# Patient Record
Sex: Female | Born: 1935 | Race: White | Hispanic: No | Marital: Married | Smoking: Never smoker
Health system: Southern US, Community
[De-identification: ages and names within clinical notes are randomized; demographics above are authoritative.]

## PROBLEM LIST (undated history)

## (undated) DIAGNOSIS — K5790 Diverticulosis of intestine, part unspecified, without perforation or abscess without bleeding: Secondary | ICD-10-CM

## (undated) DIAGNOSIS — K219 Gastro-esophageal reflux disease without esophagitis: Secondary | ICD-10-CM

## (undated) DIAGNOSIS — M1990 Unspecified osteoarthritis, unspecified site: Secondary | ICD-10-CM

## (undated) DIAGNOSIS — E785 Hyperlipidemia, unspecified: Secondary | ICD-10-CM

## (undated) DIAGNOSIS — F419 Anxiety disorder, unspecified: Secondary | ICD-10-CM

## (undated) DIAGNOSIS — R002 Palpitations: Secondary | ICD-10-CM

## (undated) DIAGNOSIS — E119 Type 2 diabetes mellitus without complications: Secondary | ICD-10-CM

## (undated) DIAGNOSIS — F32A Depression, unspecified: Secondary | ICD-10-CM

## (undated) DIAGNOSIS — K589 Irritable bowel syndrome without diarrhea: Secondary | ICD-10-CM

## (undated) DIAGNOSIS — E039 Hypothyroidism, unspecified: Secondary | ICD-10-CM

## (undated) DIAGNOSIS — D649 Anemia, unspecified: Secondary | ICD-10-CM

## (undated) DIAGNOSIS — F329 Major depressive disorder, single episode, unspecified: Secondary | ICD-10-CM

## (undated) HISTORY — DX: Hypothyroidism, unspecified: E03.9

## (undated) HISTORY — DX: Gastro-esophageal reflux disease without esophagitis: K21.9

## (undated) HISTORY — DX: Hyperlipidemia, unspecified: E78.5

## (undated) HISTORY — DX: Anemia, unspecified: D64.9

## (undated) HISTORY — PX: TONSILLECTOMY: SUR1361

## (undated) HISTORY — DX: Irritable bowel syndrome, unspecified: K58.9

## (undated) HISTORY — DX: Depression, unspecified: F32.A

## (undated) HISTORY — PX: APPENDECTOMY: SHX54

## (undated) HISTORY — DX: Palpitations: R00.2

## (undated) HISTORY — DX: Unspecified osteoarthritis, unspecified site: M19.90

## (undated) HISTORY — DX: Anxiety disorder, unspecified: F41.9

## (undated) HISTORY — DX: Diverticulosis of intestine, part unspecified, without perforation or abscess without bleeding: K57.90

## (undated) HISTORY — DX: Major depressive disorder, single episode, unspecified: F32.9

## (undated) HISTORY — DX: Type 2 diabetes mellitus without complications: E11.9

---

## 1998-04-15 ENCOUNTER — Ambulatory Visit (HOSPITAL_COMMUNITY): Admission: RE | Admit: 1998-04-15 | Discharge: 1998-04-15 | Payer: Self-pay | Admitting: Internal Medicine

## 1998-07-02 ENCOUNTER — Other Ambulatory Visit: Admission: RE | Admit: 1998-07-02 | Discharge: 1998-07-02 | Payer: Self-pay | Admitting: Gastroenterology

## 1999-08-12 ENCOUNTER — Ambulatory Visit (HOSPITAL_COMMUNITY): Admission: RE | Admit: 1999-08-12 | Discharge: 1999-08-12 | Payer: Self-pay | Admitting: Gastroenterology

## 2000-06-20 ENCOUNTER — Encounter (INDEPENDENT_AMBULATORY_CARE_PROVIDER_SITE_OTHER): Payer: Self-pay | Admitting: Specialist

## 2000-06-20 ENCOUNTER — Other Ambulatory Visit: Admission: RE | Admit: 2000-06-20 | Discharge: 2000-06-20 | Payer: Self-pay | Admitting: Gastroenterology

## 2001-05-15 ENCOUNTER — Other Ambulatory Visit: Admission: RE | Admit: 2001-05-15 | Discharge: 2001-05-15 | Payer: Self-pay | Admitting: Obstetrics and Gynecology

## 2001-08-10 ENCOUNTER — Other Ambulatory Visit: Admission: RE | Admit: 2001-08-10 | Discharge: 2001-08-10 | Payer: Self-pay | Admitting: Gastroenterology

## 2001-08-10 ENCOUNTER — Encounter (INDEPENDENT_AMBULATORY_CARE_PROVIDER_SITE_OTHER): Payer: Self-pay | Admitting: Specialist

## 2004-05-25 ENCOUNTER — Ambulatory Visit (HOSPITAL_COMMUNITY): Admission: RE | Admit: 2004-05-25 | Discharge: 2004-05-25 | Payer: Self-pay | Admitting: Family Medicine

## 2004-07-29 ENCOUNTER — Encounter (INDEPENDENT_AMBULATORY_CARE_PROVIDER_SITE_OTHER): Payer: Self-pay | Admitting: *Deleted

## 2004-07-29 ENCOUNTER — Ambulatory Visit (HOSPITAL_COMMUNITY): Admission: RE | Admit: 2004-07-29 | Discharge: 2004-07-29 | Payer: Self-pay | Admitting: Gastroenterology

## 2005-05-26 ENCOUNTER — Ambulatory Visit (HOSPITAL_COMMUNITY): Admission: RE | Admit: 2005-05-26 | Discharge: 2005-05-26 | Payer: Self-pay | Admitting: Family Medicine

## 2006-05-01 ENCOUNTER — Ambulatory Visit: Payer: Self-pay | Admitting: Gastroenterology

## 2006-05-02 ENCOUNTER — Ambulatory Visit: Payer: Self-pay | Admitting: Cardiology

## 2006-05-29 ENCOUNTER — Ambulatory Visit (HOSPITAL_COMMUNITY): Admission: RE | Admit: 2006-05-29 | Discharge: 2006-05-29 | Payer: Self-pay | Admitting: Family Medicine

## 2006-06-02 ENCOUNTER — Ambulatory Visit: Payer: Self-pay | Admitting: Gastroenterology

## 2006-07-06 ENCOUNTER — Encounter (INDEPENDENT_AMBULATORY_CARE_PROVIDER_SITE_OTHER): Payer: Self-pay | Admitting: Gastroenterology

## 2006-07-06 ENCOUNTER — Ambulatory Visit: Payer: Self-pay | Admitting: Gastroenterology

## 2007-04-18 ENCOUNTER — Ambulatory Visit: Payer: Self-pay | Admitting: Gastroenterology

## 2007-06-12 ENCOUNTER — Ambulatory Visit: Payer: Self-pay | Admitting: Internal Medicine

## 2008-02-26 DIAGNOSIS — K5730 Diverticulosis of large intestine without perforation or abscess without bleeding: Secondary | ICD-10-CM | POA: Insufficient documentation

## 2008-02-26 DIAGNOSIS — F411 Generalized anxiety disorder: Secondary | ICD-10-CM | POA: Insufficient documentation

## 2008-02-26 DIAGNOSIS — E785 Hyperlipidemia, unspecified: Secondary | ICD-10-CM | POA: Insufficient documentation

## 2008-02-26 DIAGNOSIS — H409 Unspecified glaucoma: Secondary | ICD-10-CM | POA: Insufficient documentation

## 2008-02-26 DIAGNOSIS — F329 Major depressive disorder, single episode, unspecified: Secondary | ICD-10-CM

## 2008-02-26 DIAGNOSIS — K219 Gastro-esophageal reflux disease without esophagitis: Secondary | ICD-10-CM

## 2008-02-26 DIAGNOSIS — F32A Depression, unspecified: Secondary | ICD-10-CM | POA: Insufficient documentation

## 2008-03-10 ENCOUNTER — Ambulatory Visit: Payer: Self-pay | Admitting: Internal Medicine

## 2008-07-24 ENCOUNTER — Ambulatory Visit: Payer: Self-pay | Admitting: Internal Medicine

## 2008-07-24 ENCOUNTER — Telehealth: Payer: Self-pay | Admitting: Internal Medicine

## 2008-07-24 DIAGNOSIS — K589 Irritable bowel syndrome without diarrhea: Secondary | ICD-10-CM

## 2008-07-24 DIAGNOSIS — R109 Unspecified abdominal pain: Secondary | ICD-10-CM

## 2008-07-25 ENCOUNTER — Telehealth: Payer: Self-pay | Admitting: Internal Medicine

## 2008-08-06 ENCOUNTER — Telehealth: Payer: Self-pay | Admitting: Internal Medicine

## 2008-08-18 ENCOUNTER — Ambulatory Visit: Payer: Self-pay | Admitting: Internal Medicine

## 2008-09-17 ENCOUNTER — Telehealth: Payer: Self-pay | Admitting: Internal Medicine

## 2009-03-24 ENCOUNTER — Ambulatory Visit: Payer: Self-pay | Admitting: Internal Medicine

## 2009-03-24 DIAGNOSIS — Z860100 Personal history of colon polyps, unspecified: Secondary | ICD-10-CM | POA: Insufficient documentation

## 2009-07-28 ENCOUNTER — Encounter: Admission: RE | Admit: 2009-07-28 | Discharge: 2009-07-28 | Payer: Self-pay | Admitting: Internal Medicine

## 2009-08-12 ENCOUNTER — Telehealth: Payer: Self-pay | Admitting: Internal Medicine

## 2009-08-14 ENCOUNTER — Ambulatory Visit: Payer: Self-pay | Admitting: Gastroenterology

## 2009-08-14 DIAGNOSIS — R1013 Epigastric pain: Secondary | ICD-10-CM

## 2009-08-17 ENCOUNTER — Encounter: Payer: Self-pay | Admitting: Gastroenterology

## 2009-08-17 ENCOUNTER — Telehealth: Payer: Self-pay | Admitting: Physician Assistant

## 2009-08-20 ENCOUNTER — Encounter: Payer: Self-pay | Admitting: Gastroenterology

## 2009-09-07 LAB — CONVERTED CEMR LAB
ALT: 18 units/L (ref 0–35)
AST: 19 units/L (ref 0–37)
BUN: 28 mg/dL — ABNORMAL HIGH (ref 6–23)
Calcium: 9.2 mg/dL (ref 8.4–10.5)
Creatinine, Ser: 0.9 mg/dL (ref 0.4–1.2)
Eosinophils Absolute: 0.3 10*3/uL (ref 0.0–0.7)
Eosinophils Relative: 3.1 % (ref 0.0–5.0)
GFR calc non Af Amer: 62.69 mL/min (ref >60)
Hemoglobin: 11.9 g/dL — ABNORMAL LOW (ref 12.0–15.0)
MCHC: 33.2 g/dL (ref 30.0–36.0)
Monocytes Absolute: 0.9 10*3/uL (ref 0.1–1.0)
Neutro Abs: 5.9 10*3/uL (ref 1.4–7.7)
Platelets: 184 10*3/uL (ref 150.0–400.0)
Total Bilirubin: 0.7 mg/dL (ref 0.3–1.2)
WBC: 10 10*3/uL (ref 4.5–10.5)

## 2010-03-29 ENCOUNTER — Telehealth: Payer: Self-pay | Admitting: Internal Medicine

## 2010-03-29 ENCOUNTER — Encounter: Payer: Self-pay | Admitting: Internal Medicine

## 2010-03-29 ENCOUNTER — Ambulatory Visit: Payer: Self-pay | Admitting: Internal Medicine

## 2010-03-29 DIAGNOSIS — R1084 Generalized abdominal pain: Secondary | ICD-10-CM

## 2010-03-29 DIAGNOSIS — K5900 Constipation, unspecified: Secondary | ICD-10-CM | POA: Insufficient documentation

## 2010-03-29 DIAGNOSIS — E039 Hypothyroidism, unspecified: Secondary | ICD-10-CM

## 2010-03-29 DIAGNOSIS — K625 Hemorrhage of anus and rectum: Secondary | ICD-10-CM

## 2010-04-15 ENCOUNTER — Ambulatory Visit: Payer: Self-pay | Admitting: Internal Medicine

## 2010-04-15 HISTORY — PX: COLONOSCOPY: SHX174

## 2010-07-29 ENCOUNTER — Ambulatory Visit (HOSPITAL_COMMUNITY): Admission: RE | Admit: 2010-07-29 | Discharge: 2010-07-29 | Payer: Self-pay | Admitting: Internal Medicine

## 2010-08-31 ENCOUNTER — Telehealth: Payer: Self-pay | Admitting: Internal Medicine

## 2010-09-02 ENCOUNTER — Ambulatory Visit: Payer: Self-pay | Admitting: Internal Medicine

## 2010-09-03 ENCOUNTER — Telehealth (INDEPENDENT_AMBULATORY_CARE_PROVIDER_SITE_OTHER): Payer: Self-pay | Admitting: *Deleted

## 2010-12-05 ENCOUNTER — Encounter: Payer: Self-pay | Admitting: Orthopaedic Surgery

## 2010-12-05 ENCOUNTER — Encounter: Payer: Self-pay | Admitting: Gastroenterology

## 2010-12-06 ENCOUNTER — Encounter: Payer: Self-pay | Admitting: Internal Medicine

## 2010-12-14 NOTE — Assessment & Plan Note (Signed)
Summary: diarrhea/rectal bleeding/sheri   History of Present Illness Visit Type: Follow-up Visit Primary GI MD: Stan Head MD [facility] Primary Provider: Guerry Bruin, MD Requesting Provider:  n/a Chief Complaint: Frequent, solid bowel movements,  5-4 x daily, bleeding comes from excessive wiping History of Present Illness:   [name] 77 Y.O FEMALE KNOWN TO DR. Leone Payor. SHE HAS HX OF DIVERTICULOSIS,SEVERE IN SIGMOID, AND IBS.SHE HAS SMALL COLONIC AVM'S, COLON POLYPS AND GERD.  LAST COLON WAS DONE RECENTLY 6/11. SHE COMES IN TODAY WITH C/O FREQUENT BM'S AND URGENCY AFTER MEALS. SHE HAS ALSO HAD A SMALL AMOUNT. OF RECTAL BLEEDING WITH PINKISH BLOOD ON THE TISSUE. SHE HAS FORMED STOOLS BUT FINDS THE FREQUENCY DISRUPTIVE AS SHE NEVER KNOWS WHEN IT IS GOING TO HAPPEN, AND MAY FIND HERSELF STUCK IN THE BATHROOM. SHE HAD ONE EPISODE RECENTLY WITH INCONTINENCE. SHE IS ANNOYED BY AGING AND SAYS "WHAT IS HAPPENING TO ME SHOULD NOT HAPPEN TO A DOG!'.   GI Review of Systems    Reports bloating.      Denies abdominal pain, acid reflux, belching, chest pain, dysphagia with liquids, dysphagia with solids, heartburn, loss of appetite, nausea, vomiting, vomiting blood, weight loss, and  weight gain.      Reports fecal incontinence and  rectal bleeding.     Denies anal fissure, black tarry stools, change in bowel habit, constipation, diarrhea, diverticulosis, heme positive stool, hemorrhoids, irritable bowel syndrome, jaundice, light color stool, liver problems, and  rectal pain.    Current Medications (verified): 1)  Lumigan 0.03 % Soln (Bimatoprost) .... One Drop in Each Eye Once Daily 2)  Nexium 40 Mg Cpdr (Esomeprazole Magnesium) .... Take 1 Capsule By Mouth Once A Day 30 Minutes Before Dinner 3)  Lipitor 10 Mg Tabs (Atorvastatin Calcium) .... Once Daily 4)  Cvs Vitamin D 2000 Unit Tabs (Cholecalciferol) .... Once Daily 5)  Lactaid 3000 Unit Tabs (Lactase) .... As Needed 6)  Gas-X 80 Mg Chew  (Simethicone) .... As Needed 7)  Synthroid 25 Mcg Tabs (Levothyroxine Sodium) .... One Tablet By Mouth Once Daily  Allergies (verified): No Known Drug Allergies  Past History:  Past Medical History: GERD IBS Glaucoma Anxiety/Depression Adenomatous colon Polyps- COLON 8/07 ONE HYPERPLASTIC POLYP/NO POLYPS 6/11 COLON.  Diverticulosis Hyperlipidemia  Past Surgical History: Reviewed history from 02/26/2008 and no changes required. Appendectomy Tonsillectomy  Family History: Reviewed history from 03/24/2009 and no changes required. Family History of Colon Cancer:Father 21  & Mother, paternal uncle and grandfather Family History of Diabetes: Mother  Social History: Reviewed history from 07/24/2008 and no changes required. Occupation: Retired Patient has never smoked.  Alcohol Use - no Illicit Drug Use - no Married Spends winter in [place]  Review of Systems       The patient complains of arthritis/joint pain and back pain.  The patient denies allergy/sinus, anemia, anxiety-new, blood in urine, breast changes/lumps, change in vision, confusion, cough, coughing up blood, depression-new, fainting, fatigue, fever, headaches-new, hearing problems, heart murmur, heart rhythm changes, itching, menstrual pain, muscle pains/cramps, night sweats, nosebleeds, pregnancy symptoms, shortness of breath, skin rash, sleeping problems, sore throat, swelling of feet/legs, swollen lymph glands, thirst - excessive , urination - excessive , urination changes/pain, urine leakage, vision changes, and voice change.         SEE HPI  Vital Signs:  Patient profile:   77 year old female Height:      61 inches Weight:      126 pounds BMI:     23.89 Pulse rate:  60 / minute Pulse rhythm:   regular BP sitting:   142 / 60  (left arm) Cuff size:   regular  Vitals Entered By: June McMurray CMA Duncan Dull) (September 02, 2010 2:05 PM)  Physical Exam  General:  Well developed, well nourished, no acute  distress. Head:  Normocephalic and atraumatic. Eyes:  PERRLA, no icterus. Lungs:  Clear throughout to auscultation. Heart:  Regular rate and rhythm; no murmurs, rubs,  or bruits. Abdomen:  SOFT, NONTENDER, NO MASS OR HSM,BS+ Rectal:  EXTERNAL HEMORRHOID INFLAMED, NO OTHER LESION NOTED, STOOL HEME NEGATIVE. Neurologic:  Alert and  oriented x4;  grossly normal neurologically. Psych:  Alert and cooperative. Normal mood and affect.   Impression & Recommendations:  Problem # 1:  IRRITABLE BOWEL SYNDROME (ICD-564.1) Assessment Deteriorated 77 YO FEMALE WITH IBS MANIFESTED BU URGENCY AND BOWEL FREQUENCY.  HAS TRIED PROBIOTICS WITH NO BENEFIR IS USING NO ARTIFICIAL SWEETENERS WILL GIVE TRIAL OF ROBINUL FORTE 2MG  ONCE DAILY IN AM, MAY INCREASE TO two times a day IF NEEDED.  Problem # 2:  RECTAL BLEEDING (ICD-569.3) Assessment: Unchanged SECONDARY TO EXTERNAL HEMORRHOID  ANAMANTLE CREAM TWICE DAILY  X 10 DAYS THE AS NEEDED.  Problem # 3:  GERD (ICD-530.81) Assessment: Comment Only STABLE  Problem # 4:  COLONIC POLYPS, ADENOMATOUS, HX OF (ICD-V12.72) Assessment: Comment Only LAST COLON 6/ 2011- NO POLYPS  Problem # 5:  DIVERTICULOSIS, COLON (ICD-562.10) Assessment: Comment Only  Patient Instructions: 1)  We have given you samples of Anamantle cream. We also [facility] a [facility]. 2)  We gave you samples of prescription for Anamantle HC cream.Use rectally twice daily, Am & PM.  3)  Copy sent to :Guerry Bruin, MD  4)  The medication list was reviewed and reconciled.  All changed / newly prescribed medications were explained.  A complete medication list was provided to the patient / caregiver. Prescriptions: ANUSOL-HC 2.5 % CREA (HYDROCORTISONE) use cream rectally with the lidocaine cream  #30 cc x 0   Entered by:   Lowry Ram NCMA   Authorized by:   Sammuel Cooper PA-c   Signed by:   Sammuel Cooper PA-c on 09/04/2010   Method used:   Telephoned to ...       [facility]* (retail)       [address]       [place], [place]  [id number]       Ph: [phone]       Fax: [phone]   RxID:   [id number] LIDOCAINE Use  rectally twice daily AM & PM  #30 cc x 0   Entered by:   Lowry Ram NCMA   Authorized by:   Sammuel Cooper PA-c   Signed by:   Sammuel Cooper PA-c on 09/04/2010   Method used:   Telephoned to ...       [facility]* (retail)       [address]       [place], [place]  [address]       Ph: [phone]       Fax: [phone]   RxID:   [id number] ANAMANTLE HC 3-0.5 % KIT (LIDOCAINE-HYDROCORTISONE ACE) Use  rectally twice daily AM & PM  #30 cc x 1   Entered by:   Lowry Ram NCMA   Authorized by:   Sammuel Cooper PA-c   Signed by:   Lowry Ram NCMA on 09/02/2010   Method used:   Electronically to  [facility]* (retail)       [address]       [place], [place]  [id number]       Ph: [phone]       Fax: [phone]   RxID:   [id number] ROBINUL-FORTE 2 MG TABS (GLYCOPYRROLATE) Take 1 tab every morning  #30 x 3   Entered by:   Lowry Ram NCMA   Authorized by:   Sammuel Cooper PA-c   Signed by:   Lowry Ram NCMA on 09/02/2010   Method used:   Electronically to        [facility]* (retail)       [address]       [place], [place]  [address]       Ph: [phone]       Fax: [phone]   RxID:   [id number]

## 2010-12-14 NOTE — Assessment & Plan Note (Signed)
Summary: constipation/abdominal cramps/sheri   History of Present Illness Visit Type: Follow-up Visit Primary GI MD: Stan Head MD [facility] Primary Provider: Guerry Bruin, MD Requesting Provider:  n/a Chief Complaint: Rectal pain, rectal bleeding, fatigue, constipation and diarrhea  History of Present Illness:   77 Y.O. FEMALE KNOWN TO DR. Leone Payor WITH HX OF ADENOMATOUS COLON POLYPS,DIVERTICULOSIS,AND STRONG FAMILY HX OF COLON CANCER.SHE ALSO HAS GERD WELL CONTROLLED WITH NEXIUM. SHE IS OTHERWISE IN GOOD HEALTH.HER LAST COLONOSCOPY WAS DONE IN 2007 PER DR. Doreatha Martin Paulden-ONE TRANSVERSE COLON POLYP(HYPERPLASTIC),SIGMOID DIVERTICULOSIS,INT/EXT HEMORRHOIDS.SHE WAS TO HAVE A FOLLOW UP LAST SUMMER  BUT HER HUSBAND WAS ILL AND SHE CANCELLED. SHE COMES IN TODAY WITH C/O CHANGE IN HER BOWEL HABITS OVER THE PAST 6-8 WEEKS.HER BM'S HAVE BECOME IRREGULAR WITH BM Q 2-3 DAYS,SOME DIFFICULTY EVACUATING ,INTERMITTENT CRAMPING. YESTERDAY SHE HAD A BAD DAY WITH  LOWER ABDOMINAL DISCOMFORT,SENSE OF URGENCY AND SPENT MOST OF THE DAY IN THE BR.SHE DID PASS ALOT OF STOOL OVER SEVERAL HOURS,AND NOTED SOME BRB ON THE TISSUE FROM A HEMORRHOID. SHE FELT SOME CRAMPING DURING THE NIGHT,FEELS BETTER TODAY. NO N/V,NO FEVER ETC. SHE IS WORRIED ABOUT DIVERTICULITIS.   GI Review of Systems    Reports abdominal pain.     Location of  Abdominal pain: lower abdomen.    Denies acid reflux, belching, bloating, chest pain, dysphagia with liquids, dysphagia with solids, heartburn, loss of appetite, nausea, vomiting, vomiting blood, and  weight loss.      Reports change in bowel habits, constipation, diverticulosis, hemorrhoids, rectal bleeding, and  rectal pain.     Denies anal fissure, black tarry stools, diarrhea, fecal incontinence, heme positive stool, irritable bowel syndrome, jaundice, light color stool, and  liver problems.    Current Medications (verified): 1)  Lumigan 0.03 % Soln (Bimatoprost) .... One Drop in Each Eye Once  Daily 2)  Nexium 40 Mg Cpdr (Esomeprazole Magnesium) .... Take 1 Capsule By Mouth Once A Day 30 Minutes Before Dinner 3)  Lipitor 10 Mg Tabs (Atorvastatin Calcium) .... Once Daily 4)  Cvs Vitamin D 2000 Unit Tabs (Cholecalciferol) .... Once Daily 5)  Lactaid 3000 Unit Tabs (Lactase) .... As Needed 6)  Lotemax 0.5 % Susp (Loteprednol Etabonate) .... Apply To Eye Four Times A Day As Directed 7)  Gas-X 80 Mg Chew (Simethicone) .... As Needed 8)  Synthroid 25 Mcg Tabs (Levothyroxine Sodium) .... One Tablet By Mouth Once Daily 9)  Align  Caps (Probiotic Product) .... One Capsule By Mouth Once Daily  Allergies (verified): No Known Drug Allergies  Past History:  Past Medical History: GERD IBS Glaucoma Anxiety/Depression Adenomatous colon Polyps-LAST COLON 8/07 ONE HYPERPLASTIC POLYP Diverticulosis Hyperlipidemia  Past Surgical History: Reviewed history from 02/26/2008 and no changes required. Appendectomy Tonsillectomy  Family History: Reviewed history from 03/24/2009 and no changes required. Family History of Colon Cancer:Father 62  & Mother, paternal uncle and grandfather Family History of Diabetes: Mother  Social History: Reviewed history from 07/24/2008 and no changes required. Occupation: Retired Patient has never smoked.  Alcohol Use - no Illicit Drug Use - no Married Spends winter in [place]  Review of Systems       The patient complains of fatigue.  The patient denies allergy/sinus, anemia, anxiety-new, arthritis/joint pain, back pain, blood in urine, breast changes/lumps, change in vision, confusion, cough, coughing up blood, depression-new, fainting, fever, headaches-new, hearing problems, heart murmur, heart rhythm changes, itching, menstrual pain, muscle pains/cramps, night sweats, nosebleeds, pregnancy symptoms, shortness of breath, skin rash, sleeping problems, sore throat, swelling of  feet/legs, swollen lymph glands, thirst - excessive , urination - excessive ,  urination changes/pain, urine leakage, vision changes, and voice change.         ROS OTHERWISE AS IN HPI  Vital Signs:  Patient profile:   77 year old female Height:      61 inches Weight:      123 pounds BMI:     23.32 BSA:     1.54 Pulse rate:   96 / minute Pulse rhythm:   regular BP sitting:   108 / 62  (left arm) Cuff size:   regular  Vitals Entered By: Ok Anis CMA (Mar 29, 2010 1:57 PM)  Physical Exam  General:  Well developed, well nourished, no acute distress. Head:  Normocephalic and atraumatic. Eyes:  PERRLA, no icterus. Lungs:  Clear throughout to auscultation. Heart:  Regular rate and rhythm; no murmurs, rubs,  or bruits. Abdomen:  SOFT, NONTENDER, NO MASS OR HSM,BS+ Rectal:  EXTERNAL INFLAMED HEMORRHOID,TENDER, STOOL BROWN,HEME POSITIVE Extremities:  No clubbing, cyanosis, edema or deformities noted. Neurologic:  Alert and  oriented x4;  grossly normal neurologically. Psych:  Alert and cooperative. Normal mood and affect.   Impression & Recommendations:  Problem # 1:  CHANGE IN BOWEL HABITS Assessment New 77 YO FEMALE WITH  6 WEEK HX OF ALTERED BOWEL HABITS/CONSTIPATION- WITH PROLONGED EPISODE 6/15 WITH LOWER ABDOMINAL DISCOMFORT/MULTIPLE BMS",SMALL VOLUME HEMATOCHEZIA. R/O OCCULT LESION,SYMPTOMATIC DIVERTICULOSIS,  PT IN GENERALLY GOOD HEALTH AND WITH STRONG FAMILY HX OF COLON CANCER SCHEDULE FOR COLONOSCOPY WITH DR Leone Payor ADVISED TO USE HYOMAX WHICH SHE HAS AT HOME AS NEEDED ANALPRAM SAMPLES GIVEN FOR HEMORRHOIDAL DISCOMFORT FOR as needed USE  Problem # 2:  CARCINOMA, COLON, FAMILY HX (ICD-V16.0) Assessment: Comment Only MATERNAL AND PATERNAL SIDES OF FAMILY  SEE ABOVE Orders: Colonoscopy (Colon)  Problem # 3:  HYPERLIPIDEMIA (ICD-272.4) Assessment: Comment Only  Problem # 4:  HEMORRHOIDS (ICD-455.6) Assessment: Comment Only  Problem # 5:  COLONIC POLYPS, ADENOMATOUS, HX OF (ICD-V12.72) Assessment: Comment Only LAST COLON 2007-ONE  HYPERPLASTIC POLYP  Problem # 6:  DIVERTICULOSIS, COLON (ICD-562.10) Assessment: Comment Only  Patient Instructions: 1)  We sent a perscription for the Colonoscopy prep to [facility]. 2)  We scheduled the Colonoscopy with Dr. Stan Head for 04-15-10.  3)  [facility] Patient Information Guide given to patient. 4)  Colonoscopy brochure given. 5)  Copy sent to : Guerry Bruin, MD 6)  The medication list was reviewed and reconciled.  All changed / newly prescribed medications were explained.  A complete medication list was provided to the patient / caregiver. Prescriptions: MOVIPREP 100 GM  SOLR (PEG-KCL-NACL-NASULF-NA ASC-C) As per prep instructions.  #1 x 0   Entered by:   Lowry Ram NCMA   Authorized by:   Sammuel Cooper PA-c   Signed by:   Lowry Ram NCMA on 03/29/2010   Method used:   Electronically to        [facility]* (retail)       [address]       [place], [place]  [id number]       Ph: [phone]       Fax: [phone]   RxID:   [id number]

## 2010-12-14 NOTE — Progress Notes (Signed)
Summary: triage  Phone Note Call from Patient Call back at Home Phone [phone]   Caller: Patient Call For: Dr. Leone Payor Reason for Call: Talk to Nurse Summary of Call: asked to be worked in asap for "terrible cramps" and thinks "it's diverticulitis"... next available not soon enough per pt Initial call taken by: Vallarie Mare,  Mar 29, 2010 9:53 AM  Follow-up for Phone Call        Patient has change in bowel habits, she c/o lower abdominal cramping and pain .  States she sits for hours trying to have a BM.  Patient  will come in and see Mike Gip PA at 1:45 Follow-up by: Darcey Nora RN, CGRN,  Mar 29, 2010 10:18 AM

## 2010-12-14 NOTE — Letter (Signed)
Summary: [facility] Instructions  [facility]  [address] [place], [place] [address]   Phone: [phone]  Fax: [phone]       [name]    August 01, 1918    MRN: [id number]        Procedure Day /Date: 04-15-10     Arrival Time: 10:30 AM      Procedure Time: 11:30 AM     Location of Procedure:                    X      [facility] (4th Floor)  PREPARATION FOR COLONOSCOPY WITH MOVIPREP   Starting 5 days prior to your procedure 04-10-10 do not eat nuts, seeds, popcorn, corn, beans, peas,  salads, or any raw vegetables.  Do not take any fiber supplements (e.g. Metamucil, Citrucel, and Benefiber).  THE DAY BEFORE YOUR PROCEDURE         DATE: 04-14-10 DAY: Wednesday  1.  Drink clear liquids the entire day-NO SOLID FOOD  2.  Do not drink anything colored red or purple.  Avoid juices with pulp.  No orange juice.  3.  Drink at least 64 oz. (8 glasses) of fluid/clear liquids during the day to prevent dehydration and help the prep work efficiently.  CLEAR LIQUIDS INCLUDE: Water Jello Ice Popsicles Tea (sugar ok, no milk/cream) Powdered fruit flavored drinks Coffee (sugar ok, no milk/cream) Gatorade Juice: apple, white grape, white cranberry  Lemonade Clear bullion, consomm, broth Carbonated beverages (any kind) Strained chicken noodle soup Hard Candy                             4.  In the morning, mix first dose of MoviPrep solution:    Empty 1 Pouch A and 1 Pouch B into the disposable container    Add lukewarm drinking water to the top line of the container. Mix to dissolve    Refrigerate (mixed solution should be used within 24 hrs)  5.  Begin drinking the prep at 5:00 p.m. The MoviPrep container is divided by 4 marks.   Every 15 minutes drink the solution down to the next mark (approximately 8 oz) until the full liter is complete.   6.  Follow completed prep with 16 oz of clear liquid of your choice (Nothing red or purple).   Continue to drink clear liquids until bedtime.  7.  Before going to bed, mix second dose of MoviPrep solution:    Empty 1 Pouch A and 1 Pouch B into the disposable container    Add lukewarm drinking water to the top line of the container. Mix to dissolve    Refrigerate  THE DAY OF YOUR PROCEDURE      DATE: 04-15-10  DAY: Thursday  Beginning at 6:30 AM  (5 hours before procedure):         1. Every 15 minutes, drink the solution down to the next mark (approx 8 oz) until the full liter is complete.  2. Follow completed prep with 16 oz. of clear liquid of your choice.    3. You may drink clear liquids until 9:30 AM  (2 HOURS BEFORE PROCEDURE).   MEDICATION INSTRUCTIONS  Unless otherwise instructed, you should take regular prescription medications with a small sip of water   as early as possible the morning of your procedure.       OTHER INSTRUCTIONS  You will need a responsible adult  at least 75 years of age to accompany you and drive you home.   This person must remain in the waiting room during your procedure.  Wear loose fitting clothing that is easily removed.  Leave jewelry and other valuables at home.  However, you may wish to bring a book to read or  an iPod/MP3 player to listen to music as you wait for your procedure to start.  Remove all body piercing jewelry and leave at home.  Total time from sign-in until discharge is approximately 2-3 hours.  You should go home directly after your procedure and rest.  You can resume normal activities the  day after your procedure.  The day of your procedure you should not:   Drive   Make legal decisions   Operate machinery   Drink alcohol   Return to work  You will receive specific instructions about eating, activities and medications before you leave.    The above instructions have been reviewed and explained to me by   _______________________    I fully understand and can verbalize these instructions  _____________________________ Date _________

## 2010-12-14 NOTE — Progress Notes (Signed)
Summary: [facility]  Phone Note Outgoing Call   Call placed by: Joselyn Glassman,  September 03, 2010 11:10 AM Call placed to: Pharmacy Summary of Call: I called [facility] and ordered the Anusol cream and the Lidocaine cream, 30 cc of each.  She can use them together.  Called pt to advise. Initial call taken by: Joselyn Glassman,  September 03, 2010 11:11 AM     Appended Document: [facility] The pt said she read the side effects of Robinul Foret and it mentions dry mouth.  She has that anyway and now doesn't want to take the medication.  I advised I will speak to Amy and see if there is anything else  Amy wants her to take.  I let her know it would be Monday before I speak to Amy. The pt was fine with that. I also let her know I spoke to [facility] and called in the Lidocaine cream and the Ausol cream seperately.  [facility] cannot get the Anamantle cream.

## 2010-12-14 NOTE — Procedures (Signed)
Summary: Colonoscopy  Patient: [name] Note: All result statuses are Final unless otherwise noted.  Tests: (1) Colonoscopy (COL)   COL Colonoscopy           DONE     [facility]     [address]     [place], [place]  [address]           COLONOSCOPY PROCEDURE REPORT           PATIENT:  Rebekah, Barton  MR#:  [id number]     BIRTHDATE:  08-07-1918, 89 yrs. old  GENDER:  female     ENDOSCOPIST:  Iva Boop, MD, [facility]           PROCEDURE DATE:  04/15/2010     PROCEDURE:  Colonoscopy 18[address]     ASA CLASS:  Class III     INDICATIONS:  change in bowel habits, rectal bleeding     MEDICATIONS:   Fentanyl 25 mcg IV, Versed 3 mg IV           DESCRIPTION OF PROCEDURE:   After the risks benefits and     alternatives of the procedure were thoroughly explained, informed     consent was obtained.  Digital rectal exam was performed and     revealed small external hemorrhoids.   The LB [id number]     endoscope was introduced through the anus and advanced to the     cecum, which was identified by both the appendix and ileocecal     valve, without limitations.  The quality of the prep was     excellent, using MoviPrep.  The instrument was then slowly     withdrawn as the colon was fully examined. Insertion: 4:30 minutes     Withdrawal: 6:08 minutes     <<PROCEDUREIMAGES>>           FINDINGS:  Mild diverticulosis was found in the ascending colon.     Severe diverticulosis was found in the sigmoid colon.  An A.V.     malformation was found in the sigmoid colon. It was aphthous and     non-bleeding. It was 2 - 3 mm in size.  This was otherwise a     normal examination of the colon.   Retroflexed views in the rectum     revealed internal and external hemorrhoids.    The scope was then     withdrawn from the patient and the procedure completed.           COMPLICATIONS:  None     ENDOSCOPIC IMPRESSION:     1) Mild diverticulosis in the ascending colon     2)  Severe diverticulosis in the sigmoid colon     3) 2 - 3 mm av malformation in the sigmoid colon - not bleeding           4) Otherwise normal examination of the colon with excellent     preop     5) Internal and external hemorrhoids     RECOMMENDATIONS:     regular and adequate fiber in diet     if persistent problems return to see Dr. Leone Payor in office           REPEAT EXAM:  In for not necessary, investigate signs/symptoms as     appropriate.           Iva Boop, MD, [facility]           CC:  Richared  Tisovec, MD     The Patient           n.     eSIGNED:   Iva Boop at 04/15/2010 12:03 PM           Kevan Ny, [id number]  Note: An exclamation mark (!) indicates a result that was not dispersed into the flowsheet. Document Creation Date: 04/15/2010 12:05 PM _______________________________________________________________________  (1) Order result status: Final Collection or observation date-time: 04/15/2010 11:56 Requested date-time:  Receipt date-time:  Reported date-time:  Referring Physician:   Ordering Physician: Stan Head [phone]) Specimen Source:  Source: Launa Grill Order Number: [id number] Lab site:

## 2010-12-14 NOTE — Progress Notes (Signed)
Summary: Triage  Phone Note Call from Patient Call back at Home Phone [phone]   Caller: [name] Call For: Dr. Leone Payor Reason for Call: Talk to Nurse Summary of Call: diarrhea and rectal bleeding Initial call taken by: Karna Christmas,  August 31, 2010 4:08 PM  Follow-up for Phone Call        Patient  is having diarrhea 5-6 episodes of a time.  Happens several days a week.  Also has some rectal bleeding.  Patient  wants to come in and be seen.  Patient  is scheduled with Mike Gip PA for tomorrow.  They are also advised to try imodium for diarrhea. Follow-up by: Darcey Nora RN, CGRN,  September 01, 2010 9:38 AM

## 2011-03-29 NOTE — Assessment & Plan Note (Signed)
HEALTHCARE                         GASTROENTEROLOGY OFFICE NOTE   DINISHA, CAI                   MRN:          [id number]  DATE:06/12/2007                            DOB:          Sep 09, 1918    HISTORY:  This is an 77 year old white female, longstanding patient of  Dr. Victorino Dike, who is set up to see me today in the office for  followup upon Dr. Blossom Hoops recent retirement.  Patient has a history of  diverticulosis, as well as multiple adenomatous colon polyps, and a  strong family history of colon cancer.  She also has the history of  gastroesophageal reflux disease, anxiety/depression, hyperlipidemia, and  glaucoma.  She is also status post appendectomy and tonsillectomy.  She  last saw Dr. Corinda Gubler in the office April 18, 2007.  At that time, they  discussed the possibility of repeating a colonoscopy due to the  patient's recollection that Dr. Corinda Gubler did not get a good look at her  colon.  However, she has had multiple colonoscopies, which I have  reviewed.  The last colonoscopy was performed less than one year ago, on  July 06, 2006.  At that time, patient was noted to have hemorrhoids,  diverticulosis, and a diminutive transverse colon polyp, which was  destroyed with cautery.  The examination was complete to the cecum and  her preparation described as adequate.  The patient's polyp was  hyperplastic.  Followup proposed by Dr. Corinda Gubler was deemed to be on a  p.r.n. basis.  I have reviewed this with the patient.   Her GI complaints today are those of alternating bowel habits with  occasional urgency, increased gas in the form of flatus and occasional  abdominal cramping.  She states she has had this for as many years as I  can remember.  She denies nausea, vomiting, rectal bleeding or weight-  loss.   PAST MEDICAL HISTORY:  As above.   FAMILY HISTORY:  Per diagnostic evaluation form.   SOCIAL HISTORY:  Per diagnostic evaluation  form.   REVIEW OF SYSTEMS:  Per diagnostic evaluation form.   PHYSICAL EXAM:  Well-appearing female, in no acute distress.  Blood pressure 128/52, heart rate 76, weight is 126.8 pounds.  She is 5  feet 1 inch in height.  HEENT:  Sclerae are anicteric.  Conjunctivae are pink.  Oral mucosa  intact, no adenopathy.  LUNGS:  Clear.  HEART:  Regular.  ABDOMEN:  Soft without tenderness, mass or hernia.   IMPRESSION:  1. Personal history of adenomatous colon polyps and family history of      colon cancer.  Negative complete colonoscopy less than one year      ago.  No indication for repeat exam.  2. Chronic abdominal complaints, consistent with irritable bowel      syndrome.  3. History of diverticulosis and diverticulitis.  4. History of reflux disease.   RECOMMENDATIONS:  1. Treatment with the probiotic, Align, one p.o. daily for two weeks.  2. Resume general medical care with her primary Saya Mccoll.  3. Gastroenterology followup p.r.n.     Wilhemina Bonito. Marina Goodell, MD  Electronically Signed  JNP/MedQ  DD: 06/12/2007  DT: 06/13/2007  Job #: [id number]

## 2011-03-29 NOTE — Assessment & Plan Note (Signed)
[name]                         GASTROENTEROLOGY OFFICE NOTE   GISSELLA, [name]                   MRN:          [id number]  DATE:03/10/2008                            DOB:          [birth year]    REASON FOR CONSULTATION:  Reflux and requesting endoscopy.   HISTORY:  This is an 77 year old white female with a history of  adenomatous colon polyps, diverticulosis, gastroesophageal reflux  disease, anxiety, hyperlipidemia, and glaucoma.  Also, a strong family  history of colon cancer.  Patient was last evaluated on June 12, 2007 to  establish as a new patient.  At that time, she was given Align for  irritable bowel-type complaints.  This helped.  She lives part-time in  [place].   Over the past several months, she has had problems with intermittent  hoarseness.  Also had isolated episode of vertigo.  She saw an ears,  nose, and throat specialist.  At that time, she was diagnosed with  impacted cerumen, sensory hearing loss, reflux disease, hoarseness, and  a deviated nasal septum.  Her impacted cerumen was removed.  She was  given information on reflux and advised to follow up with a GI doctor  for upper endoscopy.  Patient last underwent upper endoscopy in 1999.  No significant abnormalities found except for mild gastritis.  She  reports to me a several-month history of hoarseness.  New-onset  dysphagia to pills, such as calcium.  No solid-food dysphagia.  She  denies weight loss.  No fevers or bleeding.   ALLERGIES:  No known drug allergies.   CURRENT MEDICATIONS:  1. Lipitor 10 mg daily.  2. Medicated eye drops.  3. Baby aspirin.  4. Vitamin D.  5. Nexium 40 mg daily.  6. Calcium supplement.   PHYSICAL EXAMINATION:  A well-appearing female in no acute distress.  Blood pressure is 116/60, heart rate 60 and regular, weight 127.4 pounds  (increased to 0.6 pounds).  HEENT:  Sclerae are anicteric.  Conjunctivae are pink.  Oral mucosa  is  intact.  Posterior pharynx is unremarkable.  There is no thrush.  There  is no adenopathy.  Thyroid is normal.  LUNGS:  Clear.  HEART:  Regular.  ABDOMEN:  Soft without tenderness, mass or hernia.  Good bowel sounds  heard.  EXTREMITIES:  Without edema.   IMPRESSION:  1. Gastroesophageal reflux disease.  Possible cause for hoarseness.  2. New-onset dysphagia to pills only.  Rule out peptic stricture.  3. History of adenomatous colon polyps.  4. History of irritable bowel.   RECOMMENDATIONS:  1. Continue Nexium 40 mg daily.  2. Set up diagnostic upper endoscopy.  The nature of the procedure as      well as the risks, benefits and alternatives have been reviewed.      She understood and agreed to proceed.  3. If no significant abnormalities at the time of endoscopy, then      empirically increase Nexium to b.i.d. for 8-10 weeks to see if this      improves hoarseness.  If not, then return to her primary care      physician for additional  workup for other causes.     Wilhemina Bonito. Marina Goodell, MD  Electronically Signed    JNP/MedQ  DD: 03/10/2008  DT: 03/10/2008  Job #: [id number]

## 2011-03-29 NOTE — Assessment & Plan Note (Signed)
[facility]                         GASTROENTEROLOGY OFFICE NOTE   AVYNN, KLASSEN                   MRN:          [id number]  DATE:04/18/2007                            DOB:          [birth year]    [name] is a patient that I have taken care of for quite sometime. She  has had multiple colon polyps in the past herself and has a strong  family history for colon cancer and is very anxious about this. There  was a question about a clipped polyp in the right colon that I think I  removed several procedures prior but she is indicating that I told her  that we did not see it the last colonoscopy and I think she is anxious  enough that she would like to go through another procedure sometime in  the near future. I think overall her health is good enough that she  should be able to tolerate this without too much difficultly. She is 7  but she is basically in good health, especially for this age. Taylin  does spend about 6 months a year in [place] and 6 months here.   PHYSICAL EXAMINATION:  She weighed 129, blood pressure 130/72, pulse 68  and regular.  Oropharynx  negative.  NECK: Negative.  THYROID: Negative.  CHEST: Clear.  HEART: Revealed a regular rhythm without significant murmur.  ABDOMEN: Soft, no masses, or organomegaly. There was no bruits, or rubs,  and it was nontender.  Both inguinal areas were negative.  RECTAL: Deferred.   IMPRESSION:  1. Diverticulosis in a patient who has had multiple polyps and has a      strong family history of colon cancer and is very anxious about      this.  2. Gastroesophageal reflux disease.  3. Anxiety/depression.  4. Hyperlipidemia.  5. Status post appendectomy, tonsillectomy.  6. Glaucoma.   RECOMMENDATIONS:  She should follow up with Dr. Yancey Flemings in the near  future.     Ulyess Mort, MD  Electronically Signed    SML/MedQ  DD: 04/18/2007  DT: 04/18/2007  Job #: [id number]

## 2011-04-01 NOTE — Assessment & Plan Note (Signed)
[facility]                           GASTROENTEROLOGY OFFICE NOTE   CHASITY, OUTTEN                   MRN:          [id number]  DATE:06/02/2006                            DOB:          08-21-1918    I had a long talk with [name] about her colon.  She says she still has some  intermittent abdominal cramping at times.  She also has been concerned about  some recent hot flashes and blurred vision, which I told her needed to  really be followed up by her primary care doctor.  We talked for a long time  about her seeing a primary care doctor to discuss these symptoms and she  said she would be checking into this.  She has seen Rosalyn Gess. Norins, MD,  in the past as well.  She is anxious to have a repeat colonoscopic  examination so she has decided to schedule this in August.   Her physical examination basically is unchanged from when I recently saw her  in June.   IMPRESSION:  1.  Change in bowel habits, possibly related to diverticular disease, in a      patient who has had multiple colon polyps and has a strong family      history of colon cancer.  2.  Gastroesophageal reflux disease.  3.  Anxiety and depression.  4.  Hyperlipidemia.  5.  Status post appendectomy and tonsillectomy.  6.  Glaucoma.   RECOMMENDATIONS:  As above.                                   Ulyess Mort, MD   SML/MedQ  DD:  06/05/2006  DT:  06/06/2006  Job #:  [id number]

## 2011-04-21 ENCOUNTER — Telehealth: Payer: Self-pay | Admitting: Internal Medicine

## 2011-04-21 MED ORDER — GLYCOPYRROLATE 2 MG PO TABS: ORAL_TABLET | ORAL | Status: DC

## 2011-04-21 MED ORDER — GLYCOPYRROLATE 2 MG PO TABS: 2.0000 mg | ORAL_TABLET | Freq: Two times a day (BID) | ORAL | Status: DC

## 2011-04-21 NOTE — Telephone Encounter (Signed)
the patient is c/o multiple formed BM a day.  Same symptoms she had in 08/2010 when she saw [facility].  She has not been taking robinul that was prescribed I will call this in for her.  She will try this for a week or so and call back if no improvement and we will work her in for an appt at that time.  The patient and her husband are agreeable to this plan.

## 2011-04-21 NOTE — Telephone Encounter (Signed)
ok 

## 2011-05-30 ENCOUNTER — Telehealth: Payer: Self-pay | Admitting: Internal Medicine

## 2011-05-30 NOTE — Telephone Encounter (Signed)
Husband is very concerned.  Patient is having frequents BMs .  He states she is not having diarrhea.  He reports that she is having a BM them a few minutes later she will have another one. He want her seen immediately.  I have offered her an appt for Wed at 11:30 with Dr Leone Payor, her husband wants her seen now.  I have advised him that she can see her primary care MD if needs seen prior to Wed.

## 2011-06-01 ENCOUNTER — Other Ambulatory Visit (INDEPENDENT_AMBULATORY_CARE_PROVIDER_SITE_OTHER): Payer: Medicare Other

## 2011-06-01 ENCOUNTER — Ambulatory Visit (INDEPENDENT_AMBULATORY_CARE_PROVIDER_SITE_OTHER): Payer: Medicare Other | Admitting: Internal Medicine

## 2011-06-01 ENCOUNTER — Encounter: Payer: Self-pay | Admitting: Internal Medicine

## 2011-06-01 VITALS — BP 138/68 | HR 70 | Ht 61.0 in | Wt 119.0 lb

## 2011-06-01 DIAGNOSIS — K589 Irritable bowel syndrome without diarrhea: Secondary | ICD-10-CM

## 2011-06-01 DIAGNOSIS — E039 Hypothyroidism, unspecified: Secondary | ICD-10-CM

## 2011-06-01 LAB — TSH: TSH: 2.82 u[IU]/mL (ref 0.35–5.50)

## 2011-06-01 NOTE — Patient Instructions (Addendum)
You have been given a Low Fiber Diet Handout. If this doesn't help please let us know. Please go to the basement upon leaving today to have your labs done.

## 2011-06-01 NOTE — Progress Notes (Signed)
  Subjective:    Patient ID: [name], female    DOB: [birth year], 77 y.o.   MRN: [id number]  HPI89 yo ww with IBS here with son. "Worst ever" bowel problems. Started with numerous stools in a day and then none for a few days and cycle repeats She went on a wheat free diet per nutritionist - no help Stopped that diet July 4 and now having multiple solid movements every day.2-4/day. First stool is large and then smaller and smaller. Combination of urge and non-urge incontinence. Gets worn out after multiple stools. Not especially nocturnal but will defecate if she has to urinate at night. If she goes a lot will some blood with wiping.    Review of Systems + fatigue    Objective:   Physical Exam  Constitutional:       Elderly NAD  Cardiovascular: Normal rate, regular rhythm and normal heart sounds.   Pulmonary/Chest: Effort normal and breath sounds normal.  Abdominal: Soft. Bowel sounds are normal. She exhibits no mass. There is no tenderness.  Genitourinary:       Normal anoderm Palpable hemorrhoid/tag in anal canal, no rectal mass, brown stool and no impaction Normal resting tone and good voluntary squeeze Appropriate abdominal contraction and decsent (? Slightly greater than normal) with valsalva          Assessment & Plan:

## 2011-06-01 NOTE — Assessment & Plan Note (Addendum)
Classic sxs - multiple formed stools just now qd Sounds like she needs to reduce fiber so low fiber diet given Anorectal function appears intact and no impaction  She thinks she has been tested fior celiac disease. Even if not I am not inclined to do that now.  Check TSH

## 2011-06-01 NOTE — Assessment & Plan Note (Signed)
Could be supratherapeutic so will check TSH

## 2011-06-02 NOTE — Progress Notes (Signed)
Quick Note:  Let her know TSH - thyroid is normal No further testing recommended now I hope diet changes help her ______

## 2011-06-03 ENCOUNTER — Encounter: Payer: Self-pay | Admitting: Internal Medicine

## 2011-06-20 ENCOUNTER — Encounter: Payer: Self-pay | Admitting: Internal Medicine

## 2011-06-20 ENCOUNTER — Telehealth: Payer: Self-pay | Admitting: Internal Medicine

## 2011-06-20 NOTE — Telephone Encounter (Signed)
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 Relaxing low-fiber diet and starting low-dose mirtazipine to help depression. I concurred with these moves and discussed titratiion of diet to help IBS sxs.

## 2011-07-11 ENCOUNTER — Telehealth: Payer: Self-pay | Admitting: Internal Medicine

## 2011-07-11 NOTE — Telephone Encounter (Signed)
Patient will come in and be seen by Dr Leone Payor on 07/12/11 9:30.  Her husband is advised

## 2011-07-12 ENCOUNTER — Ambulatory Visit (INDEPENDENT_AMBULATORY_CARE_PROVIDER_SITE_OTHER): Payer: Medicare Other | Admitting: Internal Medicine

## 2011-07-12 ENCOUNTER — Encounter: Payer: Self-pay | Admitting: Internal Medicine

## 2011-07-12 DIAGNOSIS — K589 Irritable bowel syndrome without diarrhea: Secondary | ICD-10-CM

## 2011-07-12 DIAGNOSIS — K219 Gastro-esophageal reflux disease without esophagitis: Secondary | ICD-10-CM

## 2011-07-12 MED ORDER — DICYCLOMINE HCL 10 MG PO CAPS: ORAL_CAPSULE | ORAL | Status: DC

## 2011-07-12 MED ORDER — ESOMEPRAZOLE MAGNESIUM 20 MG PO CPDR: 20.0000 mg | DELAYED_RELEASE_CAPSULE | Freq: Every day | ORAL | Status: DC

## 2011-07-12 NOTE — Assessment & Plan Note (Signed)
We'll reduce the Nexium to see if that helps her defecation issues, going to 20 mg daily. She says she stopped it when she was in [place] and ended up in the hospital with chest pain.

## 2011-07-12 NOTE — Progress Notes (Signed)
  Subjective:    Patient ID: [name], female    DOB: [birth year], 77 y.o.   MRN: [id number]  HPI Here with son -  Low-residue diet did not provide consistent benefits  Also had 2 others she saw on the computer  Intermittent "diarrhea", with some incontinence. Formed stools up to 4 times a day. She has incomplete defecation problems. Unpredictable timing of these multiple bowel movements. Some cramping for the first time the other day. She tried the medication prescribed by [place] last year with benefit.  Overall a bit better. Not constipated.   Son here and asking if there is a medication for this and if stress is part of the problem. Review of Systems     Objective:   Physical Exam        Assessment & Plan:

## 2011-07-12 NOTE — Assessment & Plan Note (Signed)
She is still having problems with unpredictability of bowel movements. She gets into trouble before she leaves the house and her son I think the stress of that as part of the problem. She was put on mirtazapine at a very low dose but that made her sleep all the time.  I would reduce the dose of her Nexium to 20 mg a day if that can promote defecation and we'll see if that helps some without triggering her GERD flare. We'll also put her on hyoscyamine 0.125 mg as needed for cramps and to take one hour before she plans to leave the house. Follow up in about 6-8 weeks to regroup. She can cancel that if desired.

## 2011-07-12 NOTE — Patient Instructions (Addendum)
Pick up your prescriptions at your pharmacy. There is a new Nexium prescription for 20 mg daily as we discussed. The medication you used most recently for cramps that was expired is not now available on your formulary so I selected an alternative called dicyclomine. Take it one hour before you plan to leave the house and as needed for abdominal cramps. I plan to see you in about 8 weeks, try to make that appointment today, and if everything is going very well and you don't need that you can always call to cancel it. I will copy Dr. Leanor Rubenstein. Iva Boop, MD, [facility]

## 2011-08-09 ENCOUNTER — Other Ambulatory Visit (HOSPITAL_COMMUNITY): Payer: Self-pay | Admitting: Internal Medicine

## 2011-08-09 DIAGNOSIS — Z1231 Encounter for screening mammogram for malignant neoplasm of breast: Secondary | ICD-10-CM

## 2011-08-22 ENCOUNTER — Ambulatory Visit (HOSPITAL_COMMUNITY): Payer: Medicare Other

## 2011-08-22 ENCOUNTER — Telehealth: Payer: Self-pay | Admitting: Internal Medicine

## 2011-08-22 NOTE — Telephone Encounter (Signed)
Left message for patient to call back  

## 2011-08-22 NOTE — Telephone Encounter (Signed)
All questions about the appt answered,  He doesn't think his mother is taking hyoscyamine as prescribed.  He would like to discuss with his mom about taking meds regularly before coming back for an appt.  He will call back if they want to cancel the appt this week.

## 2011-08-24 ENCOUNTER — Ambulatory Visit: Payer: Medicare Other | Admitting: Internal Medicine

## 2011-08-24 ENCOUNTER — Telehealth: Payer: Self-pay | Admitting: Internal Medicine

## 2011-08-24 NOTE — Telephone Encounter (Signed)
Patient is rescheduled for 09/02/11 11:30

## 2011-09-02 ENCOUNTER — Encounter: Payer: Self-pay | Admitting: Internal Medicine

## 2011-09-02 ENCOUNTER — Ambulatory Visit (INDEPENDENT_AMBULATORY_CARE_PROVIDER_SITE_OTHER): Payer: Medicare Other | Admitting: Internal Medicine

## 2011-09-02 DIAGNOSIS — K589 Irritable bowel syndrome without diarrhea: Secondary | ICD-10-CM

## 2011-09-02 NOTE — Patient Instructions (Signed)
Follow up as needed

## 2011-09-02 NOTE — Progress Notes (Signed)
[id number] Jun 01, 1918  This pleasant 77 year old white woman returns with her husband of 60 years, celebrating her anniversary today. I have been seeing her for IBS. More recently I had put her on dicyclomine to use prophylactically before trips, using 10 mg at a time. She says that is working well although not perfectly life is a lot better and tolerable. Also occasionally used when necessary for cramps at home [employer]. This is as good as she's been in some time. We talked about things and how IBS is unpredictable and is no specific diet for what she has but she should avoid spicy, rich or high fiber foods in general or be prepared to suffer possible consequences of diarrhea. She seems to be suffering no side effects from the dicyclomine.   She is asking if I could help her find a primary care physician, particularly a geriatrician.

## 2011-09-02 NOTE — Assessment & Plan Note (Signed)
She finally has reasonable quality of life using intermittent dicyclomine. We'll continue that. I will see her as needed.

## 2011-12-22 DIAGNOSIS — R51 Headache: Secondary | ICD-10-CM | POA: Diagnosis not present

## 2011-12-22 DIAGNOSIS — H4011X0 Primary open-angle glaucoma, stage unspecified: Secondary | ICD-10-CM | POA: Diagnosis not present

## 2012-01-20 DIAGNOSIS — E785 Hyperlipidemia, unspecified: Secondary | ICD-10-CM | POA: Diagnosis not present

## 2012-01-20 DIAGNOSIS — D649 Anemia, unspecified: Secondary | ICD-10-CM | POA: Diagnosis not present

## 2012-01-20 DIAGNOSIS — E039 Hypothyroidism, unspecified: Secondary | ICD-10-CM | POA: Diagnosis not present

## 2012-01-20 DIAGNOSIS — E119 Type 2 diabetes mellitus without complications: Secondary | ICD-10-CM | POA: Diagnosis not present

## 2012-01-23 DIAGNOSIS — E119 Type 2 diabetes mellitus without complications: Secondary | ICD-10-CM | POA: Diagnosis not present

## 2012-01-23 DIAGNOSIS — D649 Anemia, unspecified: Secondary | ICD-10-CM | POA: Diagnosis not present

## 2012-01-23 DIAGNOSIS — K219 Gastro-esophageal reflux disease without esophagitis: Secondary | ICD-10-CM | POA: Diagnosis not present

## 2012-01-23 DIAGNOSIS — R002 Palpitations: Secondary | ICD-10-CM | POA: Diagnosis not present

## 2012-01-30 DIAGNOSIS — R002 Palpitations: Secondary | ICD-10-CM | POA: Diagnosis not present

## 2012-03-15 DIAGNOSIS — H4011X0 Primary open-angle glaucoma, stage unspecified: Secondary | ICD-10-CM | POA: Diagnosis not present

## 2012-03-27 DIAGNOSIS — H401290 Low-tension glaucoma, unspecified eye, stage unspecified: Secondary | ICD-10-CM | POA: Diagnosis not present

## 2012-04-26 DIAGNOSIS — B351 Tinea unguium: Secondary | ICD-10-CM | POA: Diagnosis not present

## 2012-04-26 DIAGNOSIS — M79609 Pain in unspecified limb: Secondary | ICD-10-CM | POA: Diagnosis not present

## 2012-05-01 DIAGNOSIS — H401290 Low-tension glaucoma, unspecified eye, stage unspecified: Secondary | ICD-10-CM | POA: Diagnosis not present

## 2012-05-23 DIAGNOSIS — H16229 Keratoconjunctivitis sicca, not specified as Sjogren's, unspecified eye: Secondary | ICD-10-CM | POA: Diagnosis not present

## 2012-05-29 DIAGNOSIS — R279 Unspecified lack of coordination: Secondary | ICD-10-CM | POA: Diagnosis not present

## 2012-05-29 DIAGNOSIS — M1710 Unilateral primary osteoarthritis, unspecified knee: Secondary | ICD-10-CM | POA: Diagnosis not present

## 2012-05-29 DIAGNOSIS — R269 Unspecified abnormalities of gait and mobility: Secondary | ICD-10-CM | POA: Diagnosis not present

## 2012-05-31 DIAGNOSIS — R269 Unspecified abnormalities of gait and mobility: Secondary | ICD-10-CM | POA: Diagnosis not present

## 2012-05-31 DIAGNOSIS — R279 Unspecified lack of coordination: Secondary | ICD-10-CM | POA: Diagnosis not present

## 2012-05-31 DIAGNOSIS — M1710 Unilateral primary osteoarthritis, unspecified knee: Secondary | ICD-10-CM | POA: Diagnosis not present

## 2012-06-01 DIAGNOSIS — M1710 Unilateral primary osteoarthritis, unspecified knee: Secondary | ICD-10-CM | POA: Diagnosis not present

## 2012-06-01 DIAGNOSIS — R279 Unspecified lack of coordination: Secondary | ICD-10-CM | POA: Diagnosis not present

## 2012-06-01 DIAGNOSIS — R269 Unspecified abnormalities of gait and mobility: Secondary | ICD-10-CM | POA: Diagnosis not present

## 2012-06-05 DIAGNOSIS — R279 Unspecified lack of coordination: Secondary | ICD-10-CM | POA: Diagnosis not present

## 2012-06-05 DIAGNOSIS — M1710 Unilateral primary osteoarthritis, unspecified knee: Secondary | ICD-10-CM | POA: Diagnosis not present

## 2012-06-05 DIAGNOSIS — R269 Unspecified abnormalities of gait and mobility: Secondary | ICD-10-CM | POA: Diagnosis not present

## 2012-06-06 DIAGNOSIS — M1710 Unilateral primary osteoarthritis, unspecified knee: Secondary | ICD-10-CM | POA: Diagnosis not present

## 2012-06-06 DIAGNOSIS — R279 Unspecified lack of coordination: Secondary | ICD-10-CM | POA: Diagnosis not present

## 2012-06-06 DIAGNOSIS — R269 Unspecified abnormalities of gait and mobility: Secondary | ICD-10-CM | POA: Diagnosis not present

## 2012-06-12 DIAGNOSIS — R269 Unspecified abnormalities of gait and mobility: Secondary | ICD-10-CM | POA: Diagnosis not present

## 2012-06-12 DIAGNOSIS — M1710 Unilateral primary osteoarthritis, unspecified knee: Secondary | ICD-10-CM | POA: Diagnosis not present

## 2012-06-12 DIAGNOSIS — R279 Unspecified lack of coordination: Secondary | ICD-10-CM | POA: Diagnosis not present

## 2012-06-19 DIAGNOSIS — R269 Unspecified abnormalities of gait and mobility: Secondary | ICD-10-CM | POA: Diagnosis not present

## 2012-06-19 DIAGNOSIS — M1710 Unilateral primary osteoarthritis, unspecified knee: Secondary | ICD-10-CM | POA: Diagnosis not present

## 2012-06-19 DIAGNOSIS — R279 Unspecified lack of coordination: Secondary | ICD-10-CM | POA: Diagnosis not present

## 2012-06-20 DIAGNOSIS — R269 Unspecified abnormalities of gait and mobility: Secondary | ICD-10-CM | POA: Diagnosis not present

## 2012-06-20 DIAGNOSIS — R279 Unspecified lack of coordination: Secondary | ICD-10-CM | POA: Diagnosis not present

## 2012-06-20 DIAGNOSIS — M1710 Unilateral primary osteoarthritis, unspecified knee: Secondary | ICD-10-CM | POA: Diagnosis not present

## 2012-06-21 DIAGNOSIS — M1710 Unilateral primary osteoarthritis, unspecified knee: Secondary | ICD-10-CM | POA: Diagnosis not present

## 2012-06-21 DIAGNOSIS — R269 Unspecified abnormalities of gait and mobility: Secondary | ICD-10-CM | POA: Diagnosis not present

## 2012-06-21 DIAGNOSIS — R279 Unspecified lack of coordination: Secondary | ICD-10-CM | POA: Diagnosis not present

## 2012-06-25 DIAGNOSIS — R269 Unspecified abnormalities of gait and mobility: Secondary | ICD-10-CM | POA: Diagnosis not present

## 2012-06-25 DIAGNOSIS — R279 Unspecified lack of coordination: Secondary | ICD-10-CM | POA: Diagnosis not present

## 2012-06-25 DIAGNOSIS — E1165 Type 2 diabetes mellitus with hyperglycemia: Secondary | ICD-10-CM | POA: Diagnosis not present

## 2012-06-25 DIAGNOSIS — N182 Chronic kidney disease, stage 2 (mild): Secondary | ICD-10-CM | POA: Diagnosis not present

## 2012-06-25 DIAGNOSIS — M1710 Unilateral primary osteoarthritis, unspecified knee: Secondary | ICD-10-CM | POA: Diagnosis not present

## 2012-06-26 DIAGNOSIS — D649 Anemia, unspecified: Secondary | ICD-10-CM | POA: Diagnosis not present

## 2012-06-26 DIAGNOSIS — E039 Hypothyroidism, unspecified: Secondary | ICD-10-CM | POA: Diagnosis not present

## 2012-06-26 DIAGNOSIS — E785 Hyperlipidemia, unspecified: Secondary | ICD-10-CM | POA: Diagnosis not present

## 2012-06-26 DIAGNOSIS — E119 Type 2 diabetes mellitus without complications: Secondary | ICD-10-CM | POA: Diagnosis not present

## 2012-07-02 DIAGNOSIS — H4011X0 Primary open-angle glaucoma, stage unspecified: Secondary | ICD-10-CM | POA: Diagnosis not present

## 2012-07-12 DIAGNOSIS — M6281 Muscle weakness (generalized): Secondary | ICD-10-CM | POA: Diagnosis not present

## 2012-07-12 DIAGNOSIS — R262 Difficulty in walking, not elsewhere classified: Secondary | ICD-10-CM | POA: Diagnosis not present

## 2012-07-12 DIAGNOSIS — M2560 Stiffness of unspecified joint, not elsewhere classified: Secondary | ICD-10-CM | POA: Diagnosis not present

## 2012-07-12 DIAGNOSIS — H547 Unspecified visual loss: Secondary | ICD-10-CM | POA: Diagnosis not present

## 2012-07-12 DIAGNOSIS — R279 Unspecified lack of coordination: Secondary | ICD-10-CM | POA: Diagnosis not present

## 2012-07-18 DIAGNOSIS — R279 Unspecified lack of coordination: Secondary | ICD-10-CM | POA: Diagnosis not present

## 2012-07-18 DIAGNOSIS — M6281 Muscle weakness (generalized): Secondary | ICD-10-CM | POA: Diagnosis not present

## 2012-07-18 DIAGNOSIS — R262 Difficulty in walking, not elsewhere classified: Secondary | ICD-10-CM | POA: Diagnosis not present

## 2012-07-18 DIAGNOSIS — M2560 Stiffness of unspecified joint, not elsewhere classified: Secondary | ICD-10-CM | POA: Diagnosis not present

## 2012-07-18 DIAGNOSIS — H547 Unspecified visual loss: Secondary | ICD-10-CM | POA: Diagnosis not present

## 2012-07-19 DIAGNOSIS — H547 Unspecified visual loss: Secondary | ICD-10-CM | POA: Diagnosis not present

## 2012-07-19 DIAGNOSIS — M2560 Stiffness of unspecified joint, not elsewhere classified: Secondary | ICD-10-CM | POA: Diagnosis not present

## 2012-07-19 DIAGNOSIS — R262 Difficulty in walking, not elsewhere classified: Secondary | ICD-10-CM | POA: Diagnosis not present

## 2012-07-19 DIAGNOSIS — M6281 Muscle weakness (generalized): Secondary | ICD-10-CM | POA: Diagnosis not present

## 2012-07-19 DIAGNOSIS — R279 Unspecified lack of coordination: Secondary | ICD-10-CM | POA: Diagnosis not present

## 2012-07-25 DIAGNOSIS — R262 Difficulty in walking, not elsewhere classified: Secondary | ICD-10-CM | POA: Diagnosis not present

## 2012-07-25 DIAGNOSIS — M6281 Muscle weakness (generalized): Secondary | ICD-10-CM | POA: Diagnosis not present

## 2012-07-25 DIAGNOSIS — R279 Unspecified lack of coordination: Secondary | ICD-10-CM | POA: Diagnosis not present

## 2012-07-25 DIAGNOSIS — H547 Unspecified visual loss: Secondary | ICD-10-CM | POA: Diagnosis not present

## 2012-07-25 DIAGNOSIS — M2560 Stiffness of unspecified joint, not elsewhere classified: Secondary | ICD-10-CM | POA: Diagnosis not present

## 2012-08-02 DIAGNOSIS — H547 Unspecified visual loss: Secondary | ICD-10-CM | POA: Diagnosis not present

## 2012-08-02 DIAGNOSIS — R279 Unspecified lack of coordination: Secondary | ICD-10-CM | POA: Diagnosis not present

## 2012-08-02 DIAGNOSIS — R262 Difficulty in walking, not elsewhere classified: Secondary | ICD-10-CM | POA: Diagnosis not present

## 2012-08-02 DIAGNOSIS — M6281 Muscle weakness (generalized): Secondary | ICD-10-CM | POA: Diagnosis not present

## 2012-08-02 DIAGNOSIS — M2560 Stiffness of unspecified joint, not elsewhere classified: Secondary | ICD-10-CM | POA: Diagnosis not present

## 2012-08-04 DIAGNOSIS — M2560 Stiffness of unspecified joint, not elsewhere classified: Secondary | ICD-10-CM | POA: Diagnosis not present

## 2012-08-04 DIAGNOSIS — R262 Difficulty in walking, not elsewhere classified: Secondary | ICD-10-CM | POA: Diagnosis not present

## 2012-08-04 DIAGNOSIS — R279 Unspecified lack of coordination: Secondary | ICD-10-CM | POA: Diagnosis not present

## 2012-08-04 DIAGNOSIS — H547 Unspecified visual loss: Secondary | ICD-10-CM | POA: Diagnosis not present

## 2012-08-04 DIAGNOSIS — M6281 Muscle weakness (generalized): Secondary | ICD-10-CM | POA: Diagnosis not present

## 2012-08-15 DIAGNOSIS — R279 Unspecified lack of coordination: Secondary | ICD-10-CM | POA: Diagnosis not present

## 2012-08-15 DIAGNOSIS — H547 Unspecified visual loss: Secondary | ICD-10-CM | POA: Diagnosis not present

## 2012-08-15 DIAGNOSIS — M2560 Stiffness of unspecified joint, not elsewhere classified: Secondary | ICD-10-CM | POA: Diagnosis not present

## 2012-08-15 DIAGNOSIS — M6281 Muscle weakness (generalized): Secondary | ICD-10-CM | POA: Diagnosis not present

## 2012-08-15 DIAGNOSIS — R262 Difficulty in walking, not elsewhere classified: Secondary | ICD-10-CM | POA: Diagnosis not present

## 2012-08-16 DIAGNOSIS — M2560 Stiffness of unspecified joint, not elsewhere classified: Secondary | ICD-10-CM | POA: Diagnosis not present

## 2012-08-16 DIAGNOSIS — R279 Unspecified lack of coordination: Secondary | ICD-10-CM | POA: Diagnosis not present

## 2012-08-16 DIAGNOSIS — R262 Difficulty in walking, not elsewhere classified: Secondary | ICD-10-CM | POA: Diagnosis not present

## 2012-08-16 DIAGNOSIS — M6281 Muscle weakness (generalized): Secondary | ICD-10-CM | POA: Diagnosis not present

## 2012-08-16 DIAGNOSIS — H547 Unspecified visual loss: Secondary | ICD-10-CM | POA: Diagnosis not present

## 2012-08-21 DIAGNOSIS — Z23 Encounter for immunization: Secondary | ICD-10-CM | POA: Diagnosis not present

## 2012-08-23 DIAGNOSIS — M2560 Stiffness of unspecified joint, not elsewhere classified: Secondary | ICD-10-CM | POA: Diagnosis not present

## 2012-08-23 DIAGNOSIS — H547 Unspecified visual loss: Secondary | ICD-10-CM | POA: Diagnosis not present

## 2012-08-23 DIAGNOSIS — R262 Difficulty in walking, not elsewhere classified: Secondary | ICD-10-CM | POA: Diagnosis not present

## 2012-08-23 DIAGNOSIS — R279 Unspecified lack of coordination: Secondary | ICD-10-CM | POA: Diagnosis not present

## 2012-08-23 DIAGNOSIS — M6281 Muscle weakness (generalized): Secondary | ICD-10-CM | POA: Diagnosis not present

## 2012-08-24 DIAGNOSIS — M6281 Muscle weakness (generalized): Secondary | ICD-10-CM | POA: Diagnosis not present

## 2012-08-24 DIAGNOSIS — R262 Difficulty in walking, not elsewhere classified: Secondary | ICD-10-CM | POA: Diagnosis not present

## 2012-08-24 DIAGNOSIS — M2560 Stiffness of unspecified joint, not elsewhere classified: Secondary | ICD-10-CM | POA: Diagnosis not present

## 2012-08-24 DIAGNOSIS — R279 Unspecified lack of coordination: Secondary | ICD-10-CM | POA: Diagnosis not present

## 2012-08-24 DIAGNOSIS — H547 Unspecified visual loss: Secondary | ICD-10-CM | POA: Diagnosis not present

## 2012-08-28 DIAGNOSIS — R634 Abnormal weight loss: Secondary | ICD-10-CM | POA: Diagnosis not present

## 2012-08-28 DIAGNOSIS — E119 Type 2 diabetes mellitus without complications: Secondary | ICD-10-CM | POA: Diagnosis not present

## 2012-08-28 DIAGNOSIS — D649 Anemia, unspecified: Secondary | ICD-10-CM | POA: Diagnosis not present

## 2012-08-28 DIAGNOSIS — M6281 Muscle weakness (generalized): Secondary | ICD-10-CM | POA: Diagnosis not present

## 2012-09-12 DIAGNOSIS — R262 Difficulty in walking, not elsewhere classified: Secondary | ICD-10-CM | POA: Diagnosis not present

## 2012-09-12 DIAGNOSIS — M2560 Stiffness of unspecified joint, not elsewhere classified: Secondary | ICD-10-CM | POA: Diagnosis not present

## 2012-09-12 DIAGNOSIS — R279 Unspecified lack of coordination: Secondary | ICD-10-CM | POA: Diagnosis not present

## 2012-09-12 DIAGNOSIS — H547 Unspecified visual loss: Secondary | ICD-10-CM | POA: Diagnosis not present

## 2012-09-12 DIAGNOSIS — M6281 Muscle weakness (generalized): Secondary | ICD-10-CM | POA: Diagnosis not present

## 2012-09-21 DIAGNOSIS — H547 Unspecified visual loss: Secondary | ICD-10-CM | POA: Diagnosis not present

## 2012-09-21 DIAGNOSIS — R279 Unspecified lack of coordination: Secondary | ICD-10-CM | POA: Diagnosis not present

## 2012-09-21 DIAGNOSIS — M2560 Stiffness of unspecified joint, not elsewhere classified: Secondary | ICD-10-CM | POA: Diagnosis not present

## 2012-09-21 DIAGNOSIS — R262 Difficulty in walking, not elsewhere classified: Secondary | ICD-10-CM | POA: Diagnosis not present

## 2012-09-21 DIAGNOSIS — M6281 Muscle weakness (generalized): Secondary | ICD-10-CM | POA: Diagnosis not present

## 2012-09-27 DIAGNOSIS — M2560 Stiffness of unspecified joint, not elsewhere classified: Secondary | ICD-10-CM | POA: Diagnosis not present

## 2012-09-27 DIAGNOSIS — R279 Unspecified lack of coordination: Secondary | ICD-10-CM | POA: Diagnosis not present

## 2012-09-27 DIAGNOSIS — R262 Difficulty in walking, not elsewhere classified: Secondary | ICD-10-CM | POA: Diagnosis not present

## 2012-09-27 DIAGNOSIS — M6281 Muscle weakness (generalized): Secondary | ICD-10-CM | POA: Diagnosis not present

## 2012-09-27 DIAGNOSIS — H547 Unspecified visual loss: Secondary | ICD-10-CM | POA: Diagnosis not present

## 2012-10-03 DIAGNOSIS — H547 Unspecified visual loss: Secondary | ICD-10-CM | POA: Diagnosis not present

## 2012-10-03 DIAGNOSIS — M6281 Muscle weakness (generalized): Secondary | ICD-10-CM | POA: Diagnosis not present

## 2012-10-03 DIAGNOSIS — R262 Difficulty in walking, not elsewhere classified: Secondary | ICD-10-CM | POA: Diagnosis not present

## 2012-10-03 DIAGNOSIS — M2560 Stiffness of unspecified joint, not elsewhere classified: Secondary | ICD-10-CM | POA: Diagnosis not present

## 2012-10-03 DIAGNOSIS — R279 Unspecified lack of coordination: Secondary | ICD-10-CM | POA: Diagnosis not present

## 2012-10-04 DIAGNOSIS — B351 Tinea unguium: Secondary | ICD-10-CM | POA: Diagnosis not present

## 2012-10-04 DIAGNOSIS — M79609 Pain in unspecified limb: Secondary | ICD-10-CM | POA: Diagnosis not present

## 2012-10-09 DIAGNOSIS — H4011X0 Primary open-angle glaucoma, stage unspecified: Secondary | ICD-10-CM | POA: Diagnosis not present

## 2012-10-17 DIAGNOSIS — M25579 Pain in unspecified ankle and joints of unspecified foot: Secondary | ICD-10-CM | POA: Diagnosis not present

## 2012-10-25 DIAGNOSIS — R269 Unspecified abnormalities of gait and mobility: Secondary | ICD-10-CM | POA: Diagnosis not present

## 2012-10-25 DIAGNOSIS — R279 Unspecified lack of coordination: Secondary | ICD-10-CM | POA: Diagnosis not present

## 2012-10-29 DIAGNOSIS — R269 Unspecified abnormalities of gait and mobility: Secondary | ICD-10-CM | POA: Diagnosis not present

## 2012-10-29 DIAGNOSIS — R279 Unspecified lack of coordination: Secondary | ICD-10-CM | POA: Diagnosis not present

## 2012-10-30 DIAGNOSIS — R269 Unspecified abnormalities of gait and mobility: Secondary | ICD-10-CM | POA: Diagnosis not present

## 2012-10-30 DIAGNOSIS — R279 Unspecified lack of coordination: Secondary | ICD-10-CM | POA: Diagnosis not present

## 2012-11-06 DIAGNOSIS — R279 Unspecified lack of coordination: Secondary | ICD-10-CM | POA: Diagnosis not present

## 2012-11-06 DIAGNOSIS — R269 Unspecified abnormalities of gait and mobility: Secondary | ICD-10-CM | POA: Diagnosis not present

## 2012-11-08 DIAGNOSIS — R269 Unspecified abnormalities of gait and mobility: Secondary | ICD-10-CM | POA: Diagnosis not present

## 2012-11-08 DIAGNOSIS — R279 Unspecified lack of coordination: Secondary | ICD-10-CM | POA: Diagnosis not present

## 2012-11-12 DIAGNOSIS — R269 Unspecified abnormalities of gait and mobility: Secondary | ICD-10-CM | POA: Diagnosis not present

## 2012-11-12 DIAGNOSIS — R279 Unspecified lack of coordination: Secondary | ICD-10-CM | POA: Diagnosis not present

## 2012-11-13 DIAGNOSIS — R269 Unspecified abnormalities of gait and mobility: Secondary | ICD-10-CM | POA: Diagnosis not present

## 2012-11-13 DIAGNOSIS — R279 Unspecified lack of coordination: Secondary | ICD-10-CM | POA: Diagnosis not present

## 2012-11-19 DIAGNOSIS — T1510XA Foreign body in conjunctival sac, unspecified eye, initial encounter: Secondary | ICD-10-CM | POA: Diagnosis not present

## 2012-11-20 DIAGNOSIS — R279 Unspecified lack of coordination: Secondary | ICD-10-CM | POA: Diagnosis not present

## 2012-11-20 DIAGNOSIS — R269 Unspecified abnormalities of gait and mobility: Secondary | ICD-10-CM | POA: Diagnosis not present

## 2012-11-22 DIAGNOSIS — R269 Unspecified abnormalities of gait and mobility: Secondary | ICD-10-CM | POA: Diagnosis not present

## 2012-11-22 DIAGNOSIS — R279 Unspecified lack of coordination: Secondary | ICD-10-CM | POA: Diagnosis not present

## 2012-11-26 DIAGNOSIS — R269 Unspecified abnormalities of gait and mobility: Secondary | ICD-10-CM | POA: Diagnosis not present

## 2012-11-26 DIAGNOSIS — R279 Unspecified lack of coordination: Secondary | ICD-10-CM | POA: Diagnosis not present

## 2012-11-27 DIAGNOSIS — R269 Unspecified abnormalities of gait and mobility: Secondary | ICD-10-CM | POA: Diagnosis not present

## 2012-11-27 DIAGNOSIS — R279 Unspecified lack of coordination: Secondary | ICD-10-CM | POA: Diagnosis not present

## 2012-12-04 DIAGNOSIS — R269 Unspecified abnormalities of gait and mobility: Secondary | ICD-10-CM | POA: Diagnosis not present

## 2012-12-04 DIAGNOSIS — R279 Unspecified lack of coordination: Secondary | ICD-10-CM | POA: Diagnosis not present

## 2012-12-13 ENCOUNTER — Emergency Department (HOSPITAL_COMMUNITY)
Admission: EM | Admit: 2012-12-13 | Discharge: 2012-12-14 | Disposition: A | Discharge status: Skilled Nursing Facility | Payer: Medicare Other | Attending: Emergency Medicine | Admitting: Emergency Medicine

## 2012-12-13 ENCOUNTER — Encounter (HOSPITAL_COMMUNITY): Payer: Self-pay | Admitting: *Deleted

## 2012-12-13 ENCOUNTER — Emergency Department (HOSPITAL_COMMUNITY): Payer: Medicare Other

## 2012-12-13 DIAGNOSIS — IMO0002 Reserved for concepts with insufficient information to code with codable children: Secondary | ICD-10-CM | POA: Diagnosis not present

## 2012-12-13 DIAGNOSIS — E039 Hypothyroidism, unspecified: Secondary | ICD-10-CM | POA: Diagnosis not present

## 2012-12-13 DIAGNOSIS — Z8679 Personal history of other diseases of the circulatory system: Secondary | ICD-10-CM | POA: Insufficient documentation

## 2012-12-13 DIAGNOSIS — Z79899 Other long term (current) drug therapy: Secondary | ICD-10-CM | POA: Insufficient documentation

## 2012-12-13 DIAGNOSIS — H409 Unspecified glaucoma: Secondary | ICD-10-CM | POA: Insufficient documentation

## 2012-12-13 DIAGNOSIS — R269 Unspecified abnormalities of gait and mobility: Secondary | ICD-10-CM | POA: Diagnosis not present

## 2012-12-13 DIAGNOSIS — Y9210 Unspecified residential institution as the place of occurrence of the external cause: Secondary | ICD-10-CM | POA: Insufficient documentation

## 2012-12-13 DIAGNOSIS — R937 Abnormal findings on diagnostic imaging of other parts of musculoskeletal system: Secondary | ICD-10-CM

## 2012-12-13 DIAGNOSIS — K219 Gastro-esophageal reflux disease without esophagitis: Secondary | ICD-10-CM | POA: Diagnosis not present

## 2012-12-13 DIAGNOSIS — Z8719 Personal history of other diseases of the digestive system: Secondary | ICD-10-CM | POA: Insufficient documentation

## 2012-12-13 DIAGNOSIS — S79919A Unspecified injury of unspecified hip, initial encounter: Secondary | ICD-10-CM | POA: Diagnosis not present

## 2012-12-13 DIAGNOSIS — M25559 Pain in unspecified hip: Secondary | ICD-10-CM | POA: Diagnosis not present

## 2012-12-13 DIAGNOSIS — R4182 Altered mental status, unspecified: Secondary | ICD-10-CM | POA: Diagnosis not present

## 2012-12-13 DIAGNOSIS — Y939 Activity, unspecified: Secondary | ICD-10-CM | POA: Insufficient documentation

## 2012-12-13 DIAGNOSIS — S79929A Unspecified injury of unspecified thigh, initial encounter: Secondary | ICD-10-CM | POA: Insufficient documentation

## 2012-12-13 DIAGNOSIS — R296 Repeated falls: Secondary | ICD-10-CM | POA: Insufficient documentation

## 2012-12-13 DIAGNOSIS — R279 Unspecified lack of coordination: Secondary | ICD-10-CM | POA: Diagnosis not present

## 2012-12-13 DIAGNOSIS — E785 Hyperlipidemia, unspecified: Secondary | ICD-10-CM | POA: Diagnosis not present

## 2012-12-13 DIAGNOSIS — F341 Dysthymic disorder: Secondary | ICD-10-CM | POA: Diagnosis not present

## 2012-12-13 DIAGNOSIS — Z8739 Personal history of other diseases of the musculoskeletal system and connective tissue: Secondary | ICD-10-CM | POA: Diagnosis not present

## 2012-12-13 DIAGNOSIS — M25579 Pain in unspecified ankle and joints of unspecified foot: Secondary | ICD-10-CM | POA: Diagnosis not present

## 2012-12-13 MED ORDER — OXYCODONE-ACETAMINOPHEN 5-325 MG PO TABS: 1.0000 | ORAL_TABLET | ORAL | Status: DC | PRN

## 2012-12-13 MED ORDER — HYDROMORPHONE HCL PF 1 MG/ML IJ SOLN
0.5000 mg | Freq: Once | INTRAMUSCULAR | Status: AC
  Administered 2012-12-13: 0.5 mg via INTRAVENOUS
  Filled 2012-12-13: qty 1

## 2012-12-13 MED ORDER — ONDANSETRON HCL 4 MG/2ML IJ SOLN
4.0000 mg | Freq: Once | INTRAMUSCULAR | Status: AC
  Administered 2012-12-13: 4 mg via INTRAVENOUS
  Filled 2012-12-13: qty 2

## 2012-12-13 NOTE — ED Provider Notes (Signed)
History    79 year old female with right hip pain. Patient had mechanical fall at approximately 4:00 this afternoon. She lost her balance in the midline with a walker on the right side. Progressively worsening right hip pain since then. She does not think she hit her head. Denies any headache, neck or acute back pain. No numbness, tingling or focal loss of strength. Patient was able to cannulate with her walker after fall low with increased amount of pain. She receives 100 mcg of fentanyl by EMS prior to arrival and currently reports no pain while at rest.  CSN: [id number]  Arrival date & time 12/13/12  1951   First MD Initiated Contact with Patient 12/13/12 2120      Chief Complaint  Patient presents with  . Fall    (Consider location/radiation/quality/duration/timing/severity/associated sxs/prior treatment) HPI  Past Medical History  Diagnosis Date  . GERD (gastroesophageal reflux disease)   . IBS (irritable bowel syndrome)   . Glaucoma(365)   . Hemorrhoids   . Anxiety and depression   . Diverticulosis   . HLD (hyperlipidemia)   . Hypothyroidism   . Arthritis     Past Surgical History  Procedure Date  . Appendectomy   . Tonsillectomy   . Colonoscopy 04/15/10    diverticulosis, sigmoid AVM, hemorrhoids    Family History  Problem Relation Age of Onset  . Colon cancer Father 25  . Diabetes Mother   . Heart attack Brother   . Parkinsonism Brother   . Colon cancer Mother   . Colon cancer Paternal Uncle   . Colon cancer Paternal Grandfather   . Colon cancer Mother     History  Substance Use Topics  . Smoking status: Never Smoker   . Smokeless tobacco: Never Used  . Alcohol Use: No    OB History    Grav Para Term Preterm Abortions TAB SAB Ect Mult Living                  Review of Systems  All systems reviewed and negative, other than as noted in HPI.   Allergies  Iohexol and Naprosyn  Home Medications   Current Outpatient Rx  Name  Route  Sig   Dispense  Refill  . ACETAMINOPHEN 325 MG PO TABS   Oral   Take 650 mg by mouth every 6 (six) hours as needed. For pain.         . ATORVASTATIN CALCIUM 20 MG PO TABS   Oral   Take 20 mg by mouth at bedtime.         Marland Kitchen BIMATOPROST 0.01 % OP SOLN   Both Eyes   Place 1 drop into both eyes daily.         . BUPROPION HCL ER (XL) 300 MG PO TB24   Oral   Take 300 mg by mouth daily.         . CHOLECALCIFEROL 2000 UNITS PO CAPS   Oral   Take 1 capsule by mouth daily. (vitamin d)         . DORZOLAMIDE HCL-TIMOLOL MAL 22.3-6.8 MG/ML OP SOLN   Both Eyes   Place 1 drop into both eyes every morning.          Marland Kitchen HYDROCODONE-ACETAMINOPHEN 5-325 MG PO TABS   Oral   Take 1 tablet by mouth once.         Marland Kitchen LEVOTHYROXINE SODIUM 25 MCG PO TABS   Oral   Take 37.5 mcg by mouth daily.          Marland Kitchen  LORAZEPAM 0.5 MG PO TABS   Oral   Take 0.5 mg by mouth once.         . LUTEIN 20 MG PO CAPS   Oral   Take 1 capsule by mouth every morning.         Marland Kitchen MIRTAZAPINE 7.5 MG PO TABS   Oral   Take 7.5 mg by mouth at bedtime.         . OMEPRAZOLE 20 MG PO CPDR   Oral   Take 20 mg by mouth 2 (two) times daily.         . TRIAMTERENE-HCTZ 37.5-25 MG PO CAPS   Oral   Take 1 capsule by mouth every morning.           BP 144/83  Pulse 64  Temp 98 F (36.7 C) (Oral)  Resp 16  SpO2 98%  Physical Exam  Nursing note and vitals reviewed. Constitutional: She is oriented to person, place, and time. She appears well-developed and well-nourished. No distress.  HENT:  Head: Normocephalic and atraumatic.  Eyes: Conjunctivae normal are normal. Right eye exhibits no discharge. Left eye exhibits no discharge.  Neck: Neck supple.  Cardiovascular: Normal rate, regular rhythm and normal heart sounds.  Exam reveals no gallop and no friction rub.   No murmur heard. Pulmonary/Chest: Effort normal and breath sounds normal. No respiratory distress.  Abdominal: Soft. She exhibits no  distension. There is no tenderness.  Musculoskeletal: She exhibits no edema and no tenderness.       Pelvis is stable to rocking. Increased pain with range of motion of the right hip. No shortening of her right lower extremity or malrotation. Neurovascular intact distally. No ankylosis or other concerning skin changes noted. No midline spinal tenderness.  Neurological: She is alert and oriented to person, place, and time.  Skin: Skin is warm and dry.  Psychiatric: She has a normal mood and affect. Her behavior is normal. Thought content normal.    ED Course  Procedures (including critical care time)  Labs Reviewed - No data to display Dg Pelvis Portable  12/13/2012  *RADIOLOGY REPORT*  Clinical Data: Fall, bilateral hip pain  PORTABLE PELVIS  Comparison: CT abdomen/pelvis 05/02/2006  Findings: Single frontal view the pelvis.  The bones diffusely osteopenic.  There is advanced left and mild right hip osteoarthritis.  Degenerative changes throughout the visualized lumbar spine.  Unremarkable bowel gas pattern.  IMPRESSION:  1.  No acute fracture identified.  The bones are diffusely osteopenic which limits evaluation for nondisplaced fractures.  If clinical concern persists, nuclear medicine bone scan or MRI could further evaluate. 2.  Advanced left and mild right hip osteoarthritis. 3.  Multilevel degenerative disc disease.   Original Report Authenticated By: Malachy Moan, M.D.      1. Hip pain   2. Nonspecific (abnormal) findings on radiological and other examination of musculoskeletal system       MDM  79 year old female with right hip pain after mechanical fall. There is no history to suggest syncope. Suspect that this is more contusion and muscular strain with progressively worsening pain after her fall. Plain x-rays are negative for fracture. Radiology recommending MRI, but patient states that she cannot tolerate this. She received a CT scan of her hip instead. Patient's CT did not  show any evidence of a fracture, but there is a lesion concerning for possible malignancy. Patient with no known past history of such. Discussed with pt and family. Can be further evaluated as an  outpt. PRN pain meds.        Raeford Razor, MD 12/13/12 2322

## 2012-12-13 NOTE — ED Notes (Signed)
Patient back from x-ray 

## 2012-12-13 NOTE — ED Notes (Signed)
Pt not able to ambulate due to medication.  Dr. Truitt Merle.

## 2012-12-13 NOTE — ED Notes (Signed)
MD at bedside. 

## 2012-12-13 NOTE — ED Notes (Signed)
[id number]> Expected date:<BR> Expected time:<BR> Means of arrival:<BR> Comments:<BR> EMS/elderly fall-possible pelvic fracture-IV pain meds

## 2012-12-13 NOTE — ED Notes (Signed)
Patient fell today around 4pm and has been complaining right hip pain.  Pain has been progressively getting worse.  Patient is from [employer].  Patient is alert and oriented x 3.  Patient was given a total of of fentanyl and 4mg  of zofran IV pta.  Pedal pulses are present.  Patient's hip/pelvic area is being immobilized with a sheet per [facility].

## 2012-12-13 NOTE — ED Notes (Addendum)
Waiting for [facility] to transport patient.  Updated MD about patient's ambulatory status.  No new orders given, Patient is appropriate for d/c per MD.  Family notified about transport and son is in agreement.

## 2012-12-14 ENCOUNTER — Other Ambulatory Visit (HOSPITAL_COMMUNITY): Payer: Self-pay | Admitting: Orthopedic Surgery

## 2012-12-14 DIAGNOSIS — M899 Disorder of bone, unspecified: Secondary | ICD-10-CM

## 2012-12-14 DIAGNOSIS — R9389 Abnormal findings on diagnostic imaging of other specified body structures: Secondary | ICD-10-CM

## 2012-12-17 DIAGNOSIS — M6281 Muscle weakness (generalized): Secondary | ICD-10-CM | POA: Diagnosis not present

## 2012-12-17 DIAGNOSIS — R279 Unspecified lack of coordination: Secondary | ICD-10-CM | POA: Diagnosis not present

## 2012-12-17 DIAGNOSIS — R269 Unspecified abnormalities of gait and mobility: Secondary | ICD-10-CM | POA: Diagnosis not present

## 2012-12-17 DIAGNOSIS — Z9181 History of falling: Secondary | ICD-10-CM | POA: Diagnosis not present

## 2012-12-17 DIAGNOSIS — M25569 Pain in unspecified knee: Secondary | ICD-10-CM | POA: Diagnosis not present

## 2012-12-18 DIAGNOSIS — M6281 Muscle weakness (generalized): Secondary | ICD-10-CM | POA: Diagnosis not present

## 2012-12-18 DIAGNOSIS — R279 Unspecified lack of coordination: Secondary | ICD-10-CM | POA: Diagnosis not present

## 2012-12-18 DIAGNOSIS — M25569 Pain in unspecified knee: Secondary | ICD-10-CM | POA: Diagnosis not present

## 2012-12-18 DIAGNOSIS — Z79899 Other long term (current) drug therapy: Secondary | ICD-10-CM | POA: Diagnosis not present

## 2012-12-18 DIAGNOSIS — Z9181 History of falling: Secondary | ICD-10-CM | POA: Diagnosis not present

## 2012-12-18 DIAGNOSIS — R269 Unspecified abnormalities of gait and mobility: Secondary | ICD-10-CM | POA: Diagnosis not present

## 2012-12-19 DIAGNOSIS — Z9181 History of falling: Secondary | ICD-10-CM | POA: Diagnosis not present

## 2012-12-19 DIAGNOSIS — M25569 Pain in unspecified knee: Secondary | ICD-10-CM | POA: Diagnosis not present

## 2012-12-19 DIAGNOSIS — M6281 Muscle weakness (generalized): Secondary | ICD-10-CM | POA: Diagnosis not present

## 2012-12-19 DIAGNOSIS — R269 Unspecified abnormalities of gait and mobility: Secondary | ICD-10-CM | POA: Diagnosis not present

## 2012-12-19 DIAGNOSIS — R279 Unspecified lack of coordination: Secondary | ICD-10-CM | POA: Diagnosis not present

## 2012-12-20 DIAGNOSIS — M6281 Muscle weakness (generalized): Secondary | ICD-10-CM | POA: Diagnosis not present

## 2012-12-20 DIAGNOSIS — R269 Unspecified abnormalities of gait and mobility: Secondary | ICD-10-CM | POA: Diagnosis not present

## 2012-12-20 DIAGNOSIS — Z9181 History of falling: Secondary | ICD-10-CM | POA: Diagnosis not present

## 2012-12-20 DIAGNOSIS — M25569 Pain in unspecified knee: Secondary | ICD-10-CM | POA: Diagnosis not present

## 2012-12-20 DIAGNOSIS — R279 Unspecified lack of coordination: Secondary | ICD-10-CM | POA: Diagnosis not present

## 2012-12-21 ENCOUNTER — Encounter (HOSPITAL_COMMUNITY)
Admission: RE | Admit: 2012-12-21 | Discharge: 2012-12-21 | Disposition: A | Discharge status: Home / Self Care | Payer: Medicare Other | Source: Ambulatory Visit | Attending: Orthopedic Surgery | Admitting: Orthopedic Surgery

## 2012-12-21 DIAGNOSIS — R269 Unspecified abnormalities of gait and mobility: Secondary | ICD-10-CM | POA: Diagnosis not present

## 2012-12-21 DIAGNOSIS — M899 Disorder of bone, unspecified: Secondary | ICD-10-CM | POA: Insufficient documentation

## 2012-12-21 DIAGNOSIS — Z9181 History of falling: Secondary | ICD-10-CM | POA: Diagnosis not present

## 2012-12-21 DIAGNOSIS — M19019 Primary osteoarthritis, unspecified shoulder: Secondary | ICD-10-CM | POA: Diagnosis not present

## 2012-12-21 DIAGNOSIS — M25569 Pain in unspecified knee: Secondary | ICD-10-CM | POA: Diagnosis not present

## 2012-12-21 DIAGNOSIS — M6281 Muscle weakness (generalized): Secondary | ICD-10-CM | POA: Diagnosis not present

## 2012-12-21 DIAGNOSIS — R9389 Abnormal findings on diagnostic imaging of other specified body structures: Secondary | ICD-10-CM | POA: Diagnosis not present

## 2012-12-21 DIAGNOSIS — R279 Unspecified lack of coordination: Secondary | ICD-10-CM | POA: Diagnosis not present

## 2012-12-21 MED ORDER — TECHNETIUM TC 99M MEDRONATE IV KIT
25.0000 | PACK | Freq: Once | INTRAVENOUS | Status: AC | PRN
  Administered 2012-12-21: 25 via INTRAVENOUS

## 2012-12-23 DIAGNOSIS — R269 Unspecified abnormalities of gait and mobility: Secondary | ICD-10-CM | POA: Diagnosis not present

## 2012-12-23 DIAGNOSIS — M25569 Pain in unspecified knee: Secondary | ICD-10-CM | POA: Diagnosis not present

## 2012-12-23 DIAGNOSIS — Z9181 History of falling: Secondary | ICD-10-CM | POA: Diagnosis not present

## 2012-12-23 DIAGNOSIS — M6281 Muscle weakness (generalized): Secondary | ICD-10-CM | POA: Diagnosis not present

## 2012-12-23 DIAGNOSIS — R279 Unspecified lack of coordination: Secondary | ICD-10-CM | POA: Diagnosis not present

## 2012-12-24 DIAGNOSIS — R279 Unspecified lack of coordination: Secondary | ICD-10-CM | POA: Diagnosis not present

## 2012-12-24 DIAGNOSIS — M25569 Pain in unspecified knee: Secondary | ICD-10-CM | POA: Diagnosis not present

## 2012-12-24 DIAGNOSIS — R269 Unspecified abnormalities of gait and mobility: Secondary | ICD-10-CM | POA: Diagnosis not present

## 2012-12-24 DIAGNOSIS — Z9181 History of falling: Secondary | ICD-10-CM | POA: Diagnosis not present

## 2012-12-24 DIAGNOSIS — M6281 Muscle weakness (generalized): Secondary | ICD-10-CM | POA: Diagnosis not present

## 2012-12-25 DIAGNOSIS — M6281 Muscle weakness (generalized): Secondary | ICD-10-CM | POA: Diagnosis not present

## 2012-12-25 DIAGNOSIS — E119 Type 2 diabetes mellitus without complications: Secondary | ICD-10-CM | POA: Diagnosis not present

## 2012-12-25 DIAGNOSIS — E870 Hyperosmolality and hypernatremia: Secondary | ICD-10-CM | POA: Diagnosis not present

## 2012-12-25 DIAGNOSIS — E785 Hyperlipidemia, unspecified: Secondary | ICD-10-CM | POA: Diagnosis not present

## 2012-12-25 DIAGNOSIS — E039 Hypothyroidism, unspecified: Secondary | ICD-10-CM | POA: Diagnosis not present

## 2012-12-25 DIAGNOSIS — M25569 Pain in unspecified knee: Secondary | ICD-10-CM | POA: Diagnosis not present

## 2012-12-25 DIAGNOSIS — Z9181 History of falling: Secondary | ICD-10-CM | POA: Diagnosis not present

## 2012-12-25 DIAGNOSIS — R269 Unspecified abnormalities of gait and mobility: Secondary | ICD-10-CM | POA: Diagnosis not present

## 2012-12-25 DIAGNOSIS — R634 Abnormal weight loss: Secondary | ICD-10-CM | POA: Diagnosis not present

## 2012-12-25 DIAGNOSIS — R279 Unspecified lack of coordination: Secondary | ICD-10-CM | POA: Diagnosis not present

## 2012-12-26 DIAGNOSIS — M6281 Muscle weakness (generalized): Secondary | ICD-10-CM | POA: Diagnosis not present

## 2012-12-26 DIAGNOSIS — R279 Unspecified lack of coordination: Secondary | ICD-10-CM | POA: Diagnosis not present

## 2012-12-26 DIAGNOSIS — M25569 Pain in unspecified knee: Secondary | ICD-10-CM | POA: Diagnosis not present

## 2012-12-26 DIAGNOSIS — Z9181 History of falling: Secondary | ICD-10-CM | POA: Diagnosis not present

## 2012-12-26 DIAGNOSIS — R269 Unspecified abnormalities of gait and mobility: Secondary | ICD-10-CM | POA: Diagnosis not present

## 2012-12-27 DIAGNOSIS — Z9181 History of falling: Secondary | ICD-10-CM | POA: Diagnosis not present

## 2012-12-27 DIAGNOSIS — M25569 Pain in unspecified knee: Secondary | ICD-10-CM | POA: Diagnosis not present

## 2012-12-27 DIAGNOSIS — R269 Unspecified abnormalities of gait and mobility: Secondary | ICD-10-CM | POA: Diagnosis not present

## 2012-12-27 DIAGNOSIS — R279 Unspecified lack of coordination: Secondary | ICD-10-CM | POA: Diagnosis not present

## 2012-12-27 DIAGNOSIS — M6281 Muscle weakness (generalized): Secondary | ICD-10-CM | POA: Diagnosis not present

## 2012-12-31 DIAGNOSIS — M6281 Muscle weakness (generalized): Secondary | ICD-10-CM | POA: Diagnosis not present

## 2012-12-31 DIAGNOSIS — Z9181 History of falling: Secondary | ICD-10-CM | POA: Diagnosis not present

## 2012-12-31 DIAGNOSIS — R269 Unspecified abnormalities of gait and mobility: Secondary | ICD-10-CM | POA: Diagnosis not present

## 2012-12-31 DIAGNOSIS — R279 Unspecified lack of coordination: Secondary | ICD-10-CM | POA: Diagnosis not present

## 2012-12-31 DIAGNOSIS — B351 Tinea unguium: Secondary | ICD-10-CM | POA: Diagnosis not present

## 2012-12-31 DIAGNOSIS — M79609 Pain in unspecified limb: Secondary | ICD-10-CM | POA: Diagnosis not present

## 2012-12-31 DIAGNOSIS — M25569 Pain in unspecified knee: Secondary | ICD-10-CM | POA: Diagnosis not present

## 2013-01-01 DIAGNOSIS — R279 Unspecified lack of coordination: Secondary | ICD-10-CM | POA: Diagnosis not present

## 2013-01-01 DIAGNOSIS — R269 Unspecified abnormalities of gait and mobility: Secondary | ICD-10-CM | POA: Diagnosis not present

## 2013-01-01 DIAGNOSIS — Z9181 History of falling: Secondary | ICD-10-CM | POA: Diagnosis not present

## 2013-01-01 DIAGNOSIS — M25569 Pain in unspecified knee: Secondary | ICD-10-CM | POA: Diagnosis not present

## 2013-01-01 DIAGNOSIS — M6281 Muscle weakness (generalized): Secondary | ICD-10-CM | POA: Diagnosis not present

## 2013-01-03 DIAGNOSIS — Z9181 History of falling: Secondary | ICD-10-CM | POA: Diagnosis not present

## 2013-01-07 DIAGNOSIS — M6281 Muscle weakness (generalized): Secondary | ICD-10-CM | POA: Diagnosis not present

## 2013-01-08 DIAGNOSIS — H4011X0 Primary open-angle glaucoma, stage unspecified: Secondary | ICD-10-CM | POA: Diagnosis not present

## 2013-01-09 DIAGNOSIS — M6281 Muscle weakness (generalized): Secondary | ICD-10-CM | POA: Diagnosis not present

## 2013-01-11 DIAGNOSIS — R279 Unspecified lack of coordination: Secondary | ICD-10-CM | POA: Diagnosis not present

## 2013-01-15 DIAGNOSIS — M6281 Muscle weakness (generalized): Secondary | ICD-10-CM | POA: Diagnosis not present

## 2013-01-16 DIAGNOSIS — Z9181 History of falling: Secondary | ICD-10-CM | POA: Diagnosis not present

## 2013-01-21 DIAGNOSIS — Z9181 History of falling: Secondary | ICD-10-CM | POA: Diagnosis not present

## 2013-01-22 DIAGNOSIS — M6281 Muscle weakness (generalized): Secondary | ICD-10-CM | POA: Diagnosis not present

## 2013-01-22 DIAGNOSIS — D649 Anemia, unspecified: Secondary | ICD-10-CM | POA: Diagnosis not present

## 2013-01-24 DIAGNOSIS — K5730 Diverticulosis of large intestine without perforation or abscess without bleeding: Secondary | ICD-10-CM | POA: Diagnosis not present

## 2013-01-24 DIAGNOSIS — M6281 Muscle weakness (generalized): Secondary | ICD-10-CM | POA: Diagnosis not present

## 2013-01-24 DIAGNOSIS — Z9181 History of falling: Secondary | ICD-10-CM | POA: Diagnosis not present

## 2013-01-29 DIAGNOSIS — M6281 Muscle weakness (generalized): Secondary | ICD-10-CM | POA: Diagnosis not present

## 2013-01-30 DIAGNOSIS — M6281 Muscle weakness (generalized): Secondary | ICD-10-CM | POA: Diagnosis not present

## 2013-01-31 DIAGNOSIS — M25569 Pain in unspecified knee: Secondary | ICD-10-CM | POA: Diagnosis not present

## 2013-02-01 DIAGNOSIS — M6281 Muscle weakness (generalized): Secondary | ICD-10-CM | POA: Diagnosis not present

## 2013-02-01 DIAGNOSIS — M25569 Pain in unspecified knee: Secondary | ICD-10-CM | POA: Diagnosis not present

## 2013-02-01 DIAGNOSIS — Z9181 History of falling: Secondary | ICD-10-CM | POA: Diagnosis not present

## 2013-02-05 DIAGNOSIS — D649 Anemia, unspecified: Secondary | ICD-10-CM | POA: Diagnosis not present

## 2013-02-05 DIAGNOSIS — M6281 Muscle weakness (generalized): Secondary | ICD-10-CM | POA: Diagnosis not present

## 2013-02-05 DIAGNOSIS — IMO0001 Reserved for inherently not codable concepts without codable children: Secondary | ICD-10-CM | POA: Diagnosis not present

## 2013-02-05 DIAGNOSIS — R634 Abnormal weight loss: Secondary | ICD-10-CM | POA: Diagnosis not present

## 2013-02-06 DIAGNOSIS — M6281 Muscle weakness (generalized): Secondary | ICD-10-CM | POA: Diagnosis not present

## 2013-02-06 DIAGNOSIS — M25569 Pain in unspecified knee: Secondary | ICD-10-CM | POA: Diagnosis not present

## 2013-02-06 DIAGNOSIS — Z9181 History of falling: Secondary | ICD-10-CM | POA: Diagnosis not present

## 2013-02-07 DIAGNOSIS — M25569 Pain in unspecified knee: Secondary | ICD-10-CM | POA: Diagnosis not present

## 2013-02-07 DIAGNOSIS — M6281 Muscle weakness (generalized): Secondary | ICD-10-CM | POA: Diagnosis not present

## 2013-02-07 DIAGNOSIS — Z9181 History of falling: Secondary | ICD-10-CM | POA: Diagnosis not present

## 2013-02-08 DIAGNOSIS — M25569 Pain in unspecified knee: Secondary | ICD-10-CM | POA: Diagnosis not present

## 2013-02-08 DIAGNOSIS — M6281 Muscle weakness (generalized): Secondary | ICD-10-CM | POA: Diagnosis not present

## 2013-02-08 DIAGNOSIS — Z9181 History of falling: Secondary | ICD-10-CM | POA: Diagnosis not present

## 2013-02-10 DIAGNOSIS — M25569 Pain in unspecified knee: Secondary | ICD-10-CM | POA: Diagnosis not present

## 2013-02-10 DIAGNOSIS — Z9181 History of falling: Secondary | ICD-10-CM | POA: Diagnosis not present

## 2013-02-10 DIAGNOSIS — M6281 Muscle weakness (generalized): Secondary | ICD-10-CM | POA: Diagnosis not present

## 2013-02-11 DIAGNOSIS — M25569 Pain in unspecified knee: Secondary | ICD-10-CM | POA: Diagnosis not present

## 2013-02-11 DIAGNOSIS — M6281 Muscle weakness (generalized): Secondary | ICD-10-CM | POA: Diagnosis not present

## 2013-02-11 DIAGNOSIS — Z9181 History of falling: Secondary | ICD-10-CM | POA: Diagnosis not present

## 2013-02-12 DIAGNOSIS — M6281 Muscle weakness (generalized): Secondary | ICD-10-CM | POA: Diagnosis not present

## 2013-02-12 DIAGNOSIS — Z9181 History of falling: Secondary | ICD-10-CM | POA: Diagnosis not present

## 2013-02-12 DIAGNOSIS — M25569 Pain in unspecified knee: Secondary | ICD-10-CM | POA: Diagnosis not present

## 2013-02-13 DIAGNOSIS — M25569 Pain in unspecified knee: Secondary | ICD-10-CM | POA: Diagnosis not present

## 2013-02-13 DIAGNOSIS — M6281 Muscle weakness (generalized): Secondary | ICD-10-CM | POA: Diagnosis not present

## 2013-02-13 DIAGNOSIS — Z9181 History of falling: Secondary | ICD-10-CM | POA: Diagnosis not present

## 2013-02-13 IMAGING — CT CT HIP*R* W/O CM
1 series · 16 of 26 positions shown, 20 images · non-contrast
Comparison: 12/13/2012 radiographs and 05/02/2006 pelvic CT

CLINICAL DATA: [AGE] female with fall and worsening right hip
pain. Unable to tolerate MRI.

CT OF THE RIGHT HIP WITHOUT CONTRAST
TECHNIQUE: Multidetector CT imaging was performed according to the
standard protocol. Multiplanar CT image reconstructions were also
generated.

[Series 3: hip st · axial · 0.38mm/px · z∈[+1000,+1114]mm · 16 of 26 slices shown, 20 images]
[im 2/26  soft-tissue]
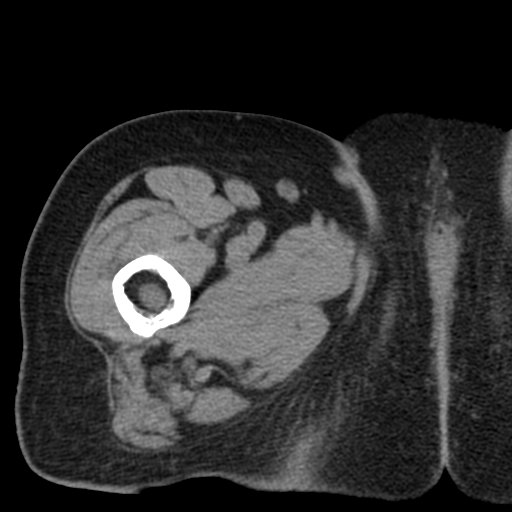
[im 2/26  bone]
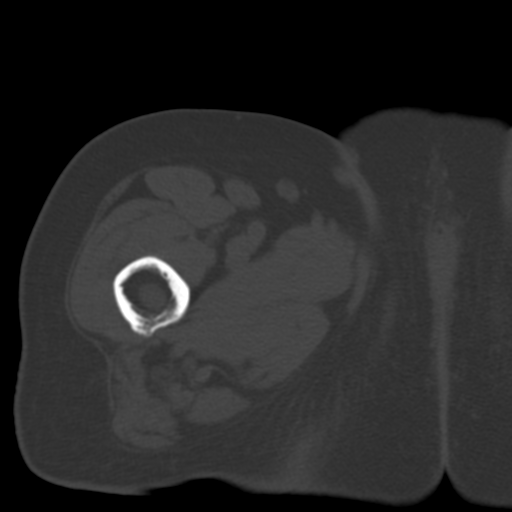
[im 4/26  soft-tissue]
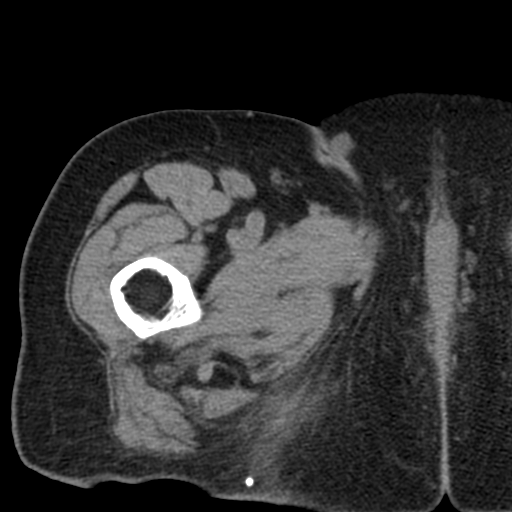
[im 6/26  soft-tissue]
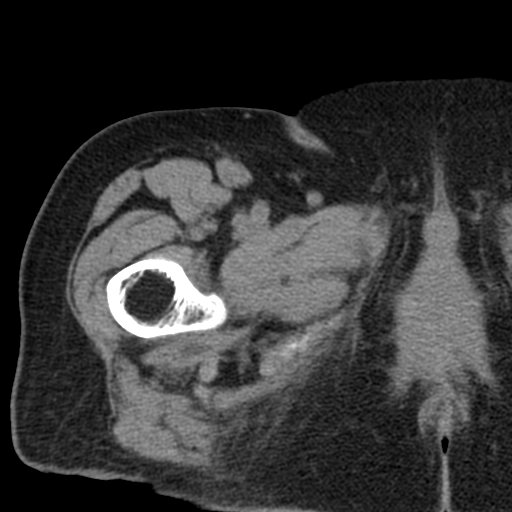
[im 8/26  soft-tissue]
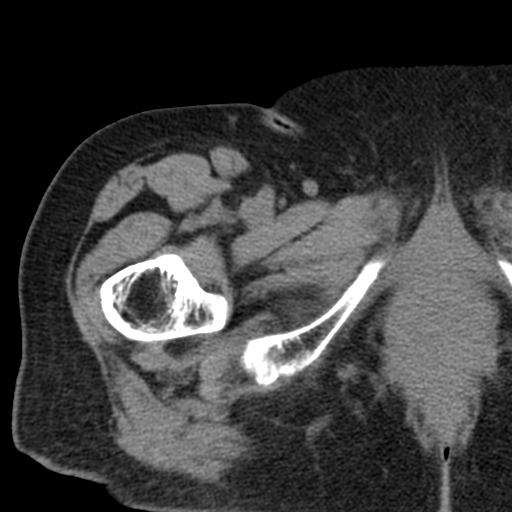
[im 9/26  soft-tissue]
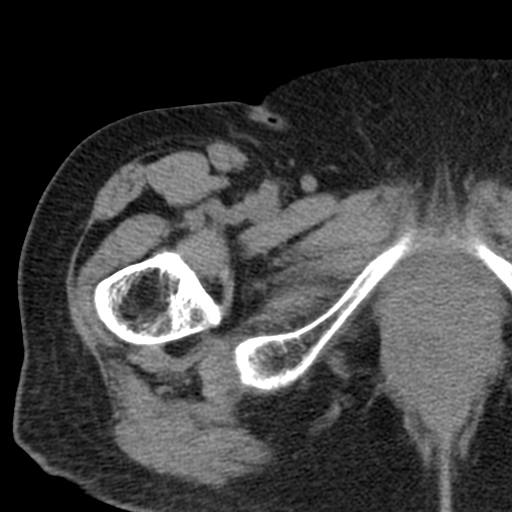
[im 11/26  soft-tissue]
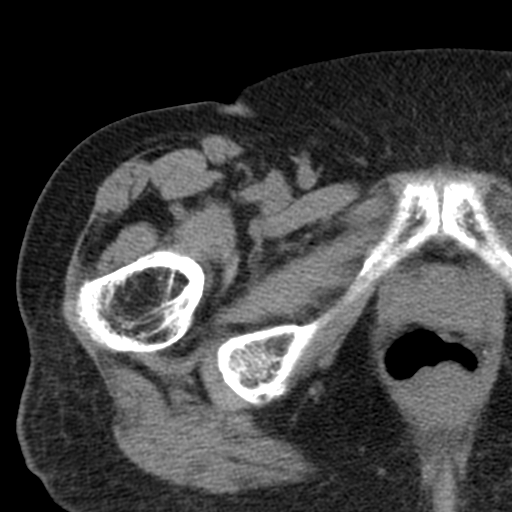
[im 13/26  soft-tissue]
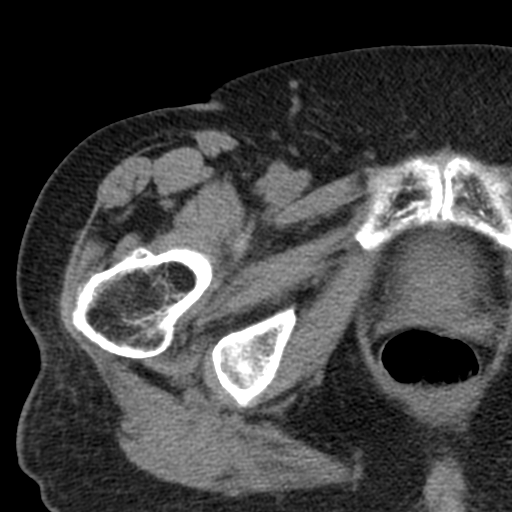
[im 14/26  soft-tissue]
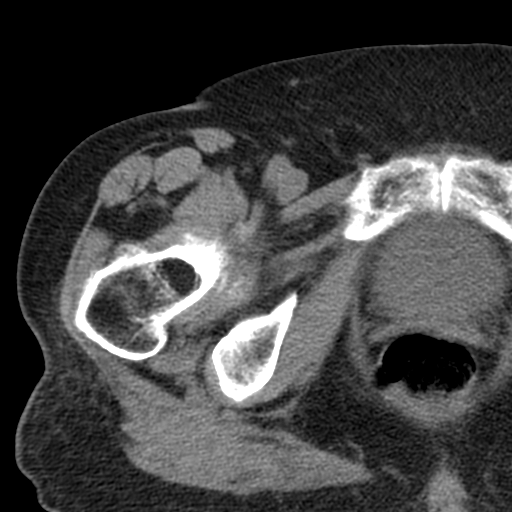
[im 16/26  soft-tissue]
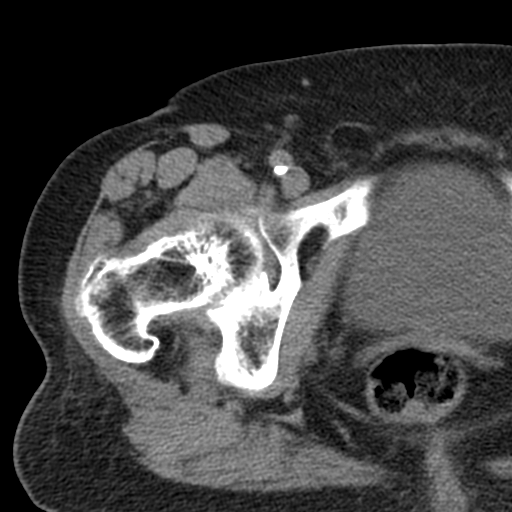
[im 16/26  bone]
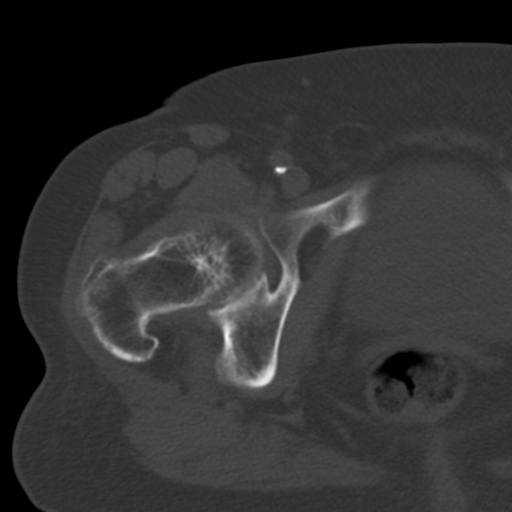
[im 18/26  soft-tissue]
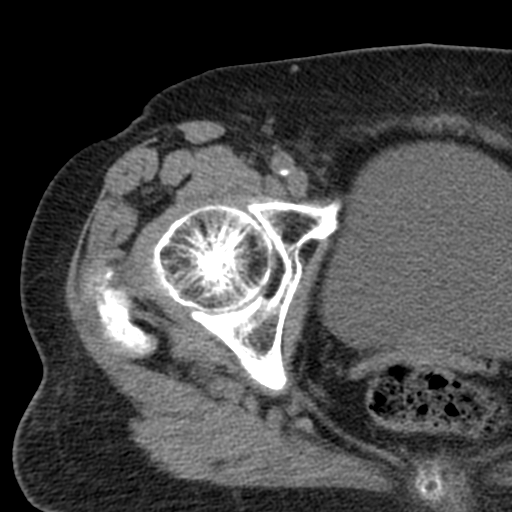
[im 20/26  soft-tissue]
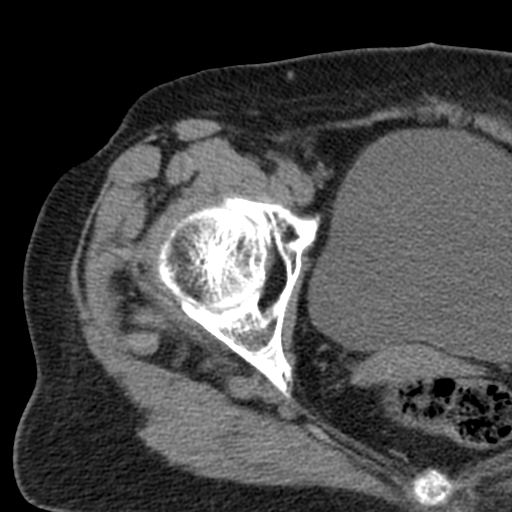
[im 21/26  soft-tissue]
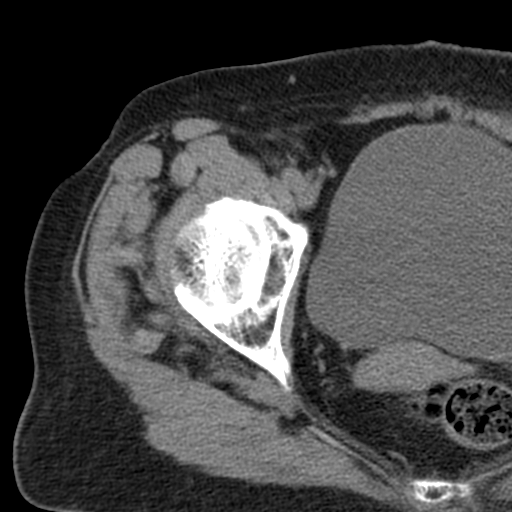
[im 22/26  lung]
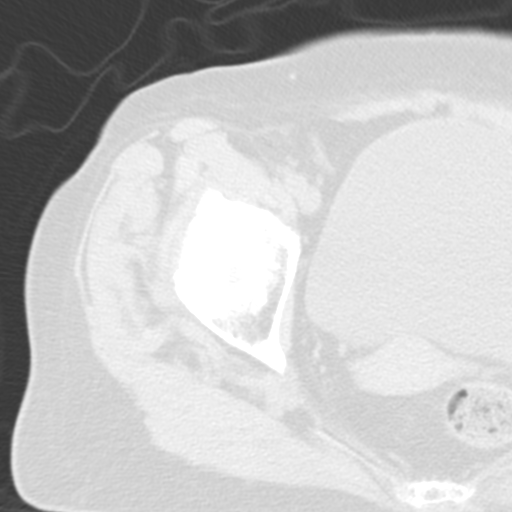
[im 23/26  soft-tissue]
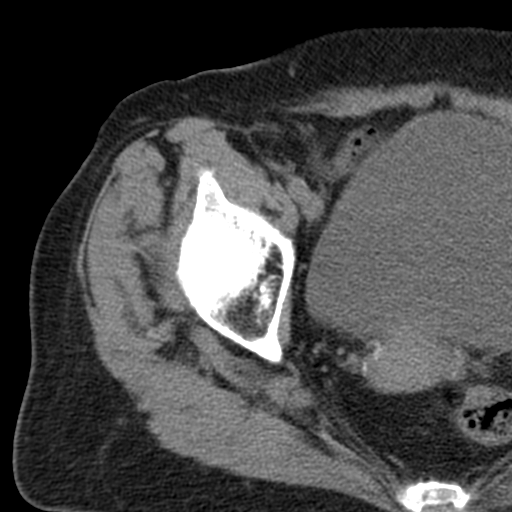
[im 23/26  lung]
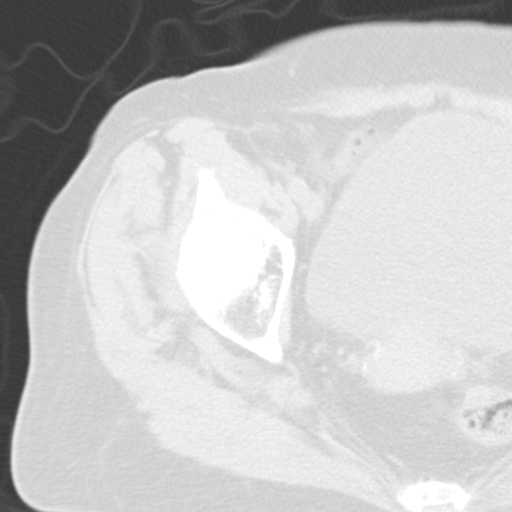
[im 24/26  lung]
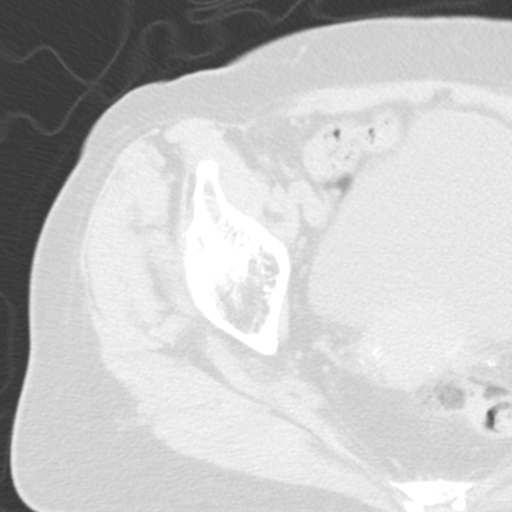
[im 25/26  soft-tissue]
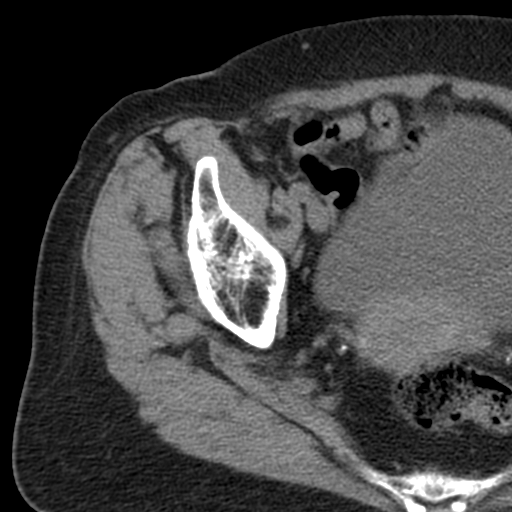
[im 25/26  lung]
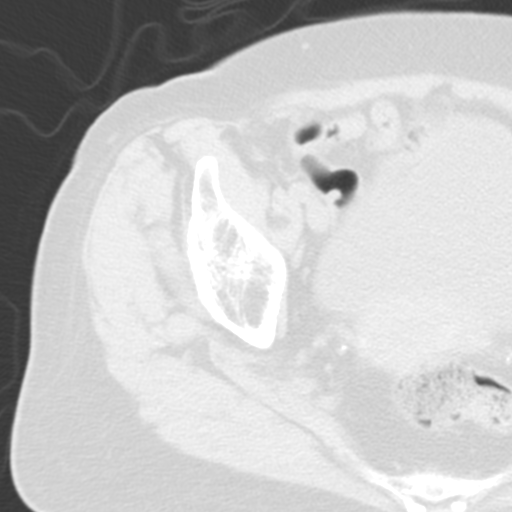

[16 of 26 positions shown; findings below may reference images not displayed]

FINDINGS: There is no evidence of acute fracture, subluxation or
dislocation.
Mild to moderate degenerative changes in the right hip noted.
A 1 cm rounded lesion within the medullary space of the proximal
femoral diaphysis is noted - suspicious. There is no evidence of
cortical thinning or destruction.
No other focal bony lesions are present.
IMPRESSION: No evidence of acute abnormality.

1 cm medullary lesion in the proximal right femoral diaphysis -
suspicious for malignancy or metastasis. Bone scan may be helpful
for further evaluation if clinically indicated.

Right hip degenerative changes.

## 2013-02-14 DIAGNOSIS — M6281 Muscle weakness (generalized): Secondary | ICD-10-CM | POA: Diagnosis not present

## 2013-02-14 DIAGNOSIS — M25569 Pain in unspecified knee: Secondary | ICD-10-CM | POA: Diagnosis not present

## 2013-02-14 DIAGNOSIS — Z9181 History of falling: Secondary | ICD-10-CM | POA: Diagnosis not present

## 2013-02-18 DIAGNOSIS — M25569 Pain in unspecified knee: Secondary | ICD-10-CM | POA: Diagnosis not present

## 2013-02-18 DIAGNOSIS — M6281 Muscle weakness (generalized): Secondary | ICD-10-CM | POA: Diagnosis not present

## 2013-02-18 DIAGNOSIS — Z9181 History of falling: Secondary | ICD-10-CM | POA: Diagnosis not present

## 2013-02-19 DIAGNOSIS — Z9181 History of falling: Secondary | ICD-10-CM | POA: Diagnosis not present

## 2013-02-19 DIAGNOSIS — M25569 Pain in unspecified knee: Secondary | ICD-10-CM | POA: Diagnosis not present

## 2013-02-19 DIAGNOSIS — M6281 Muscle weakness (generalized): Secondary | ICD-10-CM | POA: Diagnosis not present

## 2013-03-06 DIAGNOSIS — H16229 Keratoconjunctivitis sicca, not specified as Sjogren's, unspecified eye: Secondary | ICD-10-CM | POA: Diagnosis not present

## 2013-03-26 DIAGNOSIS — IMO0001 Reserved for inherently not codable concepts without codable children: Secondary | ICD-10-CM | POA: Diagnosis not present

## 2013-04-19 DIAGNOSIS — R279 Unspecified lack of coordination: Secondary | ICD-10-CM | POA: Diagnosis not present

## 2013-04-19 DIAGNOSIS — R269 Unspecified abnormalities of gait and mobility: Secondary | ICD-10-CM | POA: Diagnosis not present

## 2013-04-19 DIAGNOSIS — Z9181 History of falling: Secondary | ICD-10-CM | POA: Diagnosis not present

## 2013-05-07 DIAGNOSIS — R05 Cough: Secondary | ICD-10-CM | POA: Diagnosis not present

## 2013-05-13 DIAGNOSIS — H3530 Unspecified macular degeneration: Secondary | ICD-10-CM | POA: Diagnosis not present

## 2013-05-27 DIAGNOSIS — H4011X0 Primary open-angle glaucoma, stage unspecified: Secondary | ICD-10-CM | POA: Diagnosis not present

## 2013-07-01 DIAGNOSIS — B351 Tinea unguium: Secondary | ICD-10-CM | POA: Diagnosis not present

## 2013-07-01 DIAGNOSIS — M79609 Pain in unspecified limb: Secondary | ICD-10-CM | POA: Diagnosis not present

## 2013-07-11 DIAGNOSIS — E039 Hypothyroidism, unspecified: Secondary | ICD-10-CM | POA: Diagnosis not present

## 2013-07-11 DIAGNOSIS — M6281 Muscle weakness (generalized): Secondary | ICD-10-CM | POA: Diagnosis not present

## 2013-07-11 DIAGNOSIS — D649 Anemia, unspecified: Secondary | ICD-10-CM | POA: Diagnosis not present

## 2013-07-11 DIAGNOSIS — E785 Hyperlipidemia, unspecified: Secondary | ICD-10-CM | POA: Diagnosis not present

## 2013-07-11 DIAGNOSIS — E119 Type 2 diabetes mellitus without complications: Secondary | ICD-10-CM | POA: Diagnosis not present

## 2013-08-26 DIAGNOSIS — H4011X0 Primary open-angle glaucoma, stage unspecified: Secondary | ICD-10-CM | POA: Diagnosis not present

## 2013-09-23 ENCOUNTER — Encounter: Payer: Self-pay | Admitting: Podiatry

## 2013-09-23 ENCOUNTER — Ambulatory Visit (INDEPENDENT_AMBULATORY_CARE_PROVIDER_SITE_OTHER): Payer: Medicare Other | Admitting: Podiatry

## 2013-09-23 VITALS — BP 157/80 | HR 67 | Resp 12

## 2013-09-23 DIAGNOSIS — M79609 Pain in unspecified limb: Secondary | ICD-10-CM | POA: Diagnosis not present

## 2013-09-23 DIAGNOSIS — B351 Tinea unguium: Secondary | ICD-10-CM

## 2013-09-24 NOTE — Progress Notes (Signed)
Subjective:     Patient ID: [name], female   DOB: 11/07/1918, 79 y.o.   MRN: [id number]  HPI patient is found to have thick nailbed with pain 1-5 both feet that she cannot cut herself   Review of Systems     Objective:   Physical Exam    neurovascular status intact with thick nails that are painful when pressed Assessment:     I can't nail infection with pain 1-5 both feet    Plan:     Debridement nailbeds 1-5 both feet no iatrogenic bleeding noted

## 2013-11-25 DIAGNOSIS — H3530 Unspecified macular degeneration: Secondary | ICD-10-CM | POA: Diagnosis not present

## 2013-11-25 DIAGNOSIS — H4011X0 Primary open-angle glaucoma, stage unspecified: Secondary | ICD-10-CM | POA: Diagnosis not present

## 2013-12-23 ENCOUNTER — Ambulatory Visit: Payer: Medicare Other | Admitting: Podiatry

## 2014-01-27 ENCOUNTER — Ambulatory Visit (INDEPENDENT_AMBULATORY_CARE_PROVIDER_SITE_OTHER): Payer: Medicare Other | Admitting: Podiatry

## 2014-01-27 DIAGNOSIS — M79609 Pain in unspecified limb: Secondary | ICD-10-CM

## 2014-01-27 DIAGNOSIS — B351 Tinea unguium: Secondary | ICD-10-CM | POA: Diagnosis not present

## 2014-01-27 NOTE — Progress Notes (Signed)
   Subjective:    Patient ID: [name], female    DOB: 08-01-1918, 80 y.o.   MRN: [id number]  HPI I am here to get my toenails trimmed and I have a ingrown on my left big toenail    Review of Systems     Objective:   Physical Exam        Assessment & Plan:

## 2014-01-29 NOTE — Progress Notes (Signed)
Subjective:     Patient ID: [name], female   DOB: 06-Aug-1918, 80 y.o.   MRN: [id number]  HPI patient presents with painful nailbeds that are thick and impossible for her to cut 1-5 both feet   Review of Systems     Objective:   Physical Exam Neurovascular status intact well oriented x3 with thick painful nail bed 1-5 both feet    Assessment:     Mycotic nail infection with pain 1-5 both feet    Plan:     Debridement of painful nail bed 1-5 both feet with no bleeding noted

## 2014-02-20 DIAGNOSIS — E039 Hypothyroidism, unspecified: Secondary | ICD-10-CM | POA: Diagnosis not present

## 2014-02-20 DIAGNOSIS — E119 Type 2 diabetes mellitus without complications: Secondary | ICD-10-CM | POA: Diagnosis not present

## 2014-02-20 DIAGNOSIS — D649 Anemia, unspecified: Secondary | ICD-10-CM | POA: Diagnosis not present

## 2014-02-20 DIAGNOSIS — R635 Abnormal weight gain: Secondary | ICD-10-CM | POA: Diagnosis not present

## 2014-02-20 DIAGNOSIS — E785 Hyperlipidemia, unspecified: Secondary | ICD-10-CM | POA: Diagnosis not present

## 2014-02-24 DIAGNOSIS — H4011X0 Primary open-angle glaucoma, stage unspecified: Secondary | ICD-10-CM | POA: Diagnosis not present

## 2014-02-24 DIAGNOSIS — H3530 Unspecified macular degeneration: Secondary | ICD-10-CM | POA: Diagnosis not present

## 2014-03-10 DIAGNOSIS — H4011X0 Primary open-angle glaucoma, stage unspecified: Secondary | ICD-10-CM | POA: Diagnosis not present

## 2014-04-08 DIAGNOSIS — R059 Cough, unspecified: Secondary | ICD-10-CM | POA: Diagnosis not present

## 2014-04-08 DIAGNOSIS — R05 Cough: Secondary | ICD-10-CM | POA: Diagnosis not present

## 2014-04-14 DIAGNOSIS — J189 Pneumonia, unspecified organism: Secondary | ICD-10-CM | POA: Diagnosis not present

## 2014-04-15 DIAGNOSIS — I501 Left ventricular failure: Secondary | ICD-10-CM | POA: Diagnosis not present

## 2014-04-15 DIAGNOSIS — D643 Other sideroblastic anemias: Secondary | ICD-10-CM | POA: Diagnosis not present

## 2014-04-23 DIAGNOSIS — M6281 Muscle weakness (generalized): Secondary | ICD-10-CM | POA: Diagnosis not present

## 2014-04-23 DIAGNOSIS — R279 Unspecified lack of coordination: Secondary | ICD-10-CM | POA: Diagnosis not present

## 2014-04-23 DIAGNOSIS — Z9181 History of falling: Secondary | ICD-10-CM | POA: Diagnosis not present

## 2014-04-28 DIAGNOSIS — M6281 Muscle weakness (generalized): Secondary | ICD-10-CM | POA: Diagnosis not present

## 2014-04-28 DIAGNOSIS — Z9181 History of falling: Secondary | ICD-10-CM | POA: Diagnosis not present

## 2014-04-28 DIAGNOSIS — R279 Unspecified lack of coordination: Secondary | ICD-10-CM | POA: Diagnosis not present

## 2014-04-30 DIAGNOSIS — M6281 Muscle weakness (generalized): Secondary | ICD-10-CM | POA: Diagnosis not present

## 2014-04-30 DIAGNOSIS — Z9181 History of falling: Secondary | ICD-10-CM | POA: Diagnosis not present

## 2014-04-30 DIAGNOSIS — R279 Unspecified lack of coordination: Secondary | ICD-10-CM | POA: Diagnosis not present

## 2014-05-05 DIAGNOSIS — R279 Unspecified lack of coordination: Secondary | ICD-10-CM | POA: Diagnosis not present

## 2014-05-05 DIAGNOSIS — Z9181 History of falling: Secondary | ICD-10-CM | POA: Diagnosis not present

## 2014-05-05 DIAGNOSIS — M6281 Muscle weakness (generalized): Secondary | ICD-10-CM | POA: Diagnosis not present

## 2014-05-06 ENCOUNTER — Encounter: Payer: Self-pay | Admitting: *Deleted

## 2014-05-07 DIAGNOSIS — M6281 Muscle weakness (generalized): Secondary | ICD-10-CM | POA: Diagnosis not present

## 2014-05-07 DIAGNOSIS — R279 Unspecified lack of coordination: Secondary | ICD-10-CM | POA: Diagnosis not present

## 2014-05-07 DIAGNOSIS — Z9181 History of falling: Secondary | ICD-10-CM | POA: Diagnosis not present

## 2014-05-08 DIAGNOSIS — R279 Unspecified lack of coordination: Secondary | ICD-10-CM | POA: Diagnosis not present

## 2014-05-08 DIAGNOSIS — M6281 Muscle weakness (generalized): Secondary | ICD-10-CM | POA: Diagnosis not present

## 2014-05-08 DIAGNOSIS — Z9181 History of falling: Secondary | ICD-10-CM | POA: Diagnosis not present

## 2014-05-09 DIAGNOSIS — Z9181 History of falling: Secondary | ICD-10-CM | POA: Diagnosis not present

## 2014-05-09 DIAGNOSIS — M6281 Muscle weakness (generalized): Secondary | ICD-10-CM | POA: Diagnosis not present

## 2014-05-09 DIAGNOSIS — R279 Unspecified lack of coordination: Secondary | ICD-10-CM | POA: Diagnosis not present

## 2014-05-12 DIAGNOSIS — M6281 Muscle weakness (generalized): Secondary | ICD-10-CM | POA: Diagnosis not present

## 2014-05-12 DIAGNOSIS — R279 Unspecified lack of coordination: Secondary | ICD-10-CM | POA: Diagnosis not present

## 2014-05-12 DIAGNOSIS — Z9181 History of falling: Secondary | ICD-10-CM | POA: Diagnosis not present

## 2014-05-13 DIAGNOSIS — R279 Unspecified lack of coordination: Secondary | ICD-10-CM | POA: Diagnosis not present

## 2014-05-13 DIAGNOSIS — Z9181 History of falling: Secondary | ICD-10-CM | POA: Diagnosis not present

## 2014-05-13 DIAGNOSIS — M6281 Muscle weakness (generalized): Secondary | ICD-10-CM | POA: Diagnosis not present

## 2014-05-14 DIAGNOSIS — M6281 Muscle weakness (generalized): Secondary | ICD-10-CM | POA: Diagnosis not present

## 2014-05-14 DIAGNOSIS — R279 Unspecified lack of coordination: Secondary | ICD-10-CM | POA: Diagnosis not present

## 2014-05-14 DIAGNOSIS — Z9181 History of falling: Secondary | ICD-10-CM | POA: Diagnosis not present

## 2014-05-15 DIAGNOSIS — Z9181 History of falling: Secondary | ICD-10-CM | POA: Diagnosis not present

## 2014-05-15 DIAGNOSIS — R279 Unspecified lack of coordination: Secondary | ICD-10-CM | POA: Diagnosis not present

## 2014-05-15 DIAGNOSIS — M6281 Muscle weakness (generalized): Secondary | ICD-10-CM | POA: Diagnosis not present

## 2014-05-16 DIAGNOSIS — R279 Unspecified lack of coordination: Secondary | ICD-10-CM | POA: Diagnosis not present

## 2014-05-16 DIAGNOSIS — M6281 Muscle weakness (generalized): Secondary | ICD-10-CM | POA: Diagnosis not present

## 2014-05-16 DIAGNOSIS — Z9181 History of falling: Secondary | ICD-10-CM | POA: Diagnosis not present

## 2014-05-19 DIAGNOSIS — R279 Unspecified lack of coordination: Secondary | ICD-10-CM | POA: Diagnosis not present

## 2014-05-19 DIAGNOSIS — Z9181 History of falling: Secondary | ICD-10-CM | POA: Diagnosis not present

## 2014-05-19 DIAGNOSIS — M6281 Muscle weakness (generalized): Secondary | ICD-10-CM | POA: Diagnosis not present

## 2014-05-21 DIAGNOSIS — R279 Unspecified lack of coordination: Secondary | ICD-10-CM | POA: Diagnosis not present

## 2014-05-21 DIAGNOSIS — Z9181 History of falling: Secondary | ICD-10-CM | POA: Diagnosis not present

## 2014-05-21 DIAGNOSIS — M6281 Muscle weakness (generalized): Secondary | ICD-10-CM | POA: Diagnosis not present

## 2014-05-22 DIAGNOSIS — M6281 Muscle weakness (generalized): Secondary | ICD-10-CM | POA: Diagnosis not present

## 2014-05-22 DIAGNOSIS — Z9181 History of falling: Secondary | ICD-10-CM | POA: Diagnosis not present

## 2014-05-22 DIAGNOSIS — R279 Unspecified lack of coordination: Secondary | ICD-10-CM | POA: Diagnosis not present

## 2014-05-23 DIAGNOSIS — R279 Unspecified lack of coordination: Secondary | ICD-10-CM | POA: Diagnosis not present

## 2014-05-23 DIAGNOSIS — Z9181 History of falling: Secondary | ICD-10-CM | POA: Diagnosis not present

## 2014-05-23 DIAGNOSIS — M6281 Muscle weakness (generalized): Secondary | ICD-10-CM | POA: Diagnosis not present

## 2014-05-26 DIAGNOSIS — Z9181 History of falling: Secondary | ICD-10-CM | POA: Diagnosis not present

## 2014-05-26 DIAGNOSIS — M6281 Muscle weakness (generalized): Secondary | ICD-10-CM | POA: Diagnosis not present

## 2014-05-26 DIAGNOSIS — R279 Unspecified lack of coordination: Secondary | ICD-10-CM | POA: Diagnosis not present

## 2014-05-28 DIAGNOSIS — Z9181 History of falling: Secondary | ICD-10-CM | POA: Diagnosis not present

## 2014-05-28 DIAGNOSIS — M6281 Muscle weakness (generalized): Secondary | ICD-10-CM | POA: Diagnosis not present

## 2014-05-28 DIAGNOSIS — R279 Unspecified lack of coordination: Secondary | ICD-10-CM | POA: Diagnosis not present

## 2014-05-29 DIAGNOSIS — Z9181 History of falling: Secondary | ICD-10-CM | POA: Diagnosis not present

## 2014-05-29 DIAGNOSIS — M6281 Muscle weakness (generalized): Secondary | ICD-10-CM | POA: Diagnosis not present

## 2014-05-29 DIAGNOSIS — R279 Unspecified lack of coordination: Secondary | ICD-10-CM | POA: Diagnosis not present

## 2014-05-30 DIAGNOSIS — Z9181 History of falling: Secondary | ICD-10-CM | POA: Diagnosis not present

## 2014-05-30 DIAGNOSIS — R279 Unspecified lack of coordination: Secondary | ICD-10-CM | POA: Diagnosis not present

## 2014-05-30 DIAGNOSIS — M6281 Muscle weakness (generalized): Secondary | ICD-10-CM | POA: Diagnosis not present

## 2014-06-02 DIAGNOSIS — Z9181 History of falling: Secondary | ICD-10-CM | POA: Diagnosis not present

## 2014-06-02 DIAGNOSIS — R279 Unspecified lack of coordination: Secondary | ICD-10-CM | POA: Diagnosis not present

## 2014-06-02 DIAGNOSIS — M6281 Muscle weakness (generalized): Secondary | ICD-10-CM | POA: Diagnosis not present

## 2014-06-03 DIAGNOSIS — Z9181 History of falling: Secondary | ICD-10-CM | POA: Diagnosis not present

## 2014-06-03 DIAGNOSIS — R279 Unspecified lack of coordination: Secondary | ICD-10-CM | POA: Diagnosis not present

## 2014-06-03 DIAGNOSIS — M6281 Muscle weakness (generalized): Secondary | ICD-10-CM | POA: Diagnosis not present

## 2014-06-04 DIAGNOSIS — R279 Unspecified lack of coordination: Secondary | ICD-10-CM | POA: Diagnosis not present

## 2014-06-04 DIAGNOSIS — M6281 Muscle weakness (generalized): Secondary | ICD-10-CM | POA: Diagnosis not present

## 2014-06-04 DIAGNOSIS — Z9181 History of falling: Secondary | ICD-10-CM | POA: Diagnosis not present

## 2014-06-06 DIAGNOSIS — Z9181 History of falling: Secondary | ICD-10-CM | POA: Diagnosis not present

## 2014-06-06 DIAGNOSIS — R279 Unspecified lack of coordination: Secondary | ICD-10-CM | POA: Diagnosis not present

## 2014-06-06 DIAGNOSIS — M6281 Muscle weakness (generalized): Secondary | ICD-10-CM | POA: Diagnosis not present

## 2014-06-09 DIAGNOSIS — M6281 Muscle weakness (generalized): Secondary | ICD-10-CM | POA: Diagnosis not present

## 2014-06-09 DIAGNOSIS — R279 Unspecified lack of coordination: Secondary | ICD-10-CM | POA: Diagnosis not present

## 2014-06-09 DIAGNOSIS — Z9181 History of falling: Secondary | ICD-10-CM | POA: Diagnosis not present

## 2014-06-10 DIAGNOSIS — M6281 Muscle weakness (generalized): Secondary | ICD-10-CM | POA: Diagnosis not present

## 2014-06-10 DIAGNOSIS — R279 Unspecified lack of coordination: Secondary | ICD-10-CM | POA: Diagnosis not present

## 2014-06-10 DIAGNOSIS — Z9181 History of falling: Secondary | ICD-10-CM | POA: Diagnosis not present

## 2014-06-11 DIAGNOSIS — R279 Unspecified lack of coordination: Secondary | ICD-10-CM | POA: Diagnosis not present

## 2014-06-11 DIAGNOSIS — M6281 Muscle weakness (generalized): Secondary | ICD-10-CM | POA: Diagnosis not present

## 2014-06-11 DIAGNOSIS — Z9181 History of falling: Secondary | ICD-10-CM | POA: Diagnosis not present

## 2014-06-12 DIAGNOSIS — I10 Essential (primary) hypertension: Secondary | ICD-10-CM | POA: Diagnosis not present

## 2014-06-12 DIAGNOSIS — D649 Anemia, unspecified: Secondary | ICD-10-CM | POA: Diagnosis not present

## 2014-06-12 DIAGNOSIS — R279 Unspecified lack of coordination: Secondary | ICD-10-CM | POA: Diagnosis not present

## 2014-06-12 DIAGNOSIS — Z9181 History of falling: Secondary | ICD-10-CM | POA: Diagnosis not present

## 2014-06-12 DIAGNOSIS — E119 Type 2 diabetes mellitus without complications: Secondary | ICD-10-CM | POA: Diagnosis not present

## 2014-06-12 DIAGNOSIS — M6281 Muscle weakness (generalized): Secondary | ICD-10-CM | POA: Diagnosis not present

## 2014-06-13 DIAGNOSIS — Z9181 History of falling: Secondary | ICD-10-CM | POA: Diagnosis not present

## 2014-06-13 DIAGNOSIS — M6281 Muscle weakness (generalized): Secondary | ICD-10-CM | POA: Diagnosis not present

## 2014-06-13 DIAGNOSIS — R279 Unspecified lack of coordination: Secondary | ICD-10-CM | POA: Diagnosis not present

## 2014-06-17 DIAGNOSIS — R488 Other symbolic dysfunctions: Secondary | ICD-10-CM | POA: Diagnosis not present

## 2014-06-17 DIAGNOSIS — Z9181 History of falling: Secondary | ICD-10-CM | POA: Diagnosis not present

## 2014-06-17 DIAGNOSIS — R279 Unspecified lack of coordination: Secondary | ICD-10-CM | POA: Diagnosis not present

## 2014-06-17 DIAGNOSIS — M6281 Muscle weakness (generalized): Secondary | ICD-10-CM | POA: Diagnosis not present

## 2014-06-24 DIAGNOSIS — R279 Unspecified lack of coordination: Secondary | ICD-10-CM | POA: Diagnosis not present

## 2014-06-24 DIAGNOSIS — M6281 Muscle weakness (generalized): Secondary | ICD-10-CM | POA: Diagnosis not present

## 2014-06-24 DIAGNOSIS — Z9181 History of falling: Secondary | ICD-10-CM | POA: Diagnosis not present

## 2014-06-24 DIAGNOSIS — R488 Other symbolic dysfunctions: Secondary | ICD-10-CM | POA: Diagnosis not present

## 2014-06-25 DIAGNOSIS — Z9181 History of falling: Secondary | ICD-10-CM | POA: Diagnosis not present

## 2014-06-25 DIAGNOSIS — M6281 Muscle weakness (generalized): Secondary | ICD-10-CM | POA: Diagnosis not present

## 2014-06-25 DIAGNOSIS — R488 Other symbolic dysfunctions: Secondary | ICD-10-CM | POA: Diagnosis not present

## 2014-06-25 DIAGNOSIS — R279 Unspecified lack of coordination: Secondary | ICD-10-CM | POA: Diagnosis not present

## 2014-06-26 DIAGNOSIS — M6281 Muscle weakness (generalized): Secondary | ICD-10-CM | POA: Diagnosis not present

## 2014-06-26 DIAGNOSIS — Z9181 History of falling: Secondary | ICD-10-CM | POA: Diagnosis not present

## 2014-06-26 DIAGNOSIS — R279 Unspecified lack of coordination: Secondary | ICD-10-CM | POA: Diagnosis not present

## 2014-06-26 DIAGNOSIS — R488 Other symbolic dysfunctions: Secondary | ICD-10-CM | POA: Diagnosis not present

## 2014-06-27 DIAGNOSIS — M6281 Muscle weakness (generalized): Secondary | ICD-10-CM | POA: Diagnosis not present

## 2014-06-27 DIAGNOSIS — R279 Unspecified lack of coordination: Secondary | ICD-10-CM | POA: Diagnosis not present

## 2014-06-27 DIAGNOSIS — Z9181 History of falling: Secondary | ICD-10-CM | POA: Diagnosis not present

## 2014-06-27 DIAGNOSIS — R488 Other symbolic dysfunctions: Secondary | ICD-10-CM | POA: Diagnosis not present

## 2014-06-30 DIAGNOSIS — R279 Unspecified lack of coordination: Secondary | ICD-10-CM | POA: Diagnosis not present

## 2014-06-30 DIAGNOSIS — R488 Other symbolic dysfunctions: Secondary | ICD-10-CM | POA: Diagnosis not present

## 2014-06-30 DIAGNOSIS — M6281 Muscle weakness (generalized): Secondary | ICD-10-CM | POA: Diagnosis not present

## 2014-06-30 DIAGNOSIS — Z9181 History of falling: Secondary | ICD-10-CM | POA: Diagnosis not present

## 2014-07-01 DIAGNOSIS — R279 Unspecified lack of coordination: Secondary | ICD-10-CM | POA: Diagnosis not present

## 2014-07-01 DIAGNOSIS — R488 Other symbolic dysfunctions: Secondary | ICD-10-CM | POA: Diagnosis not present

## 2014-07-01 DIAGNOSIS — M6281 Muscle weakness (generalized): Secondary | ICD-10-CM | POA: Diagnosis not present

## 2014-07-01 DIAGNOSIS — Z9181 History of falling: Secondary | ICD-10-CM | POA: Diagnosis not present

## 2014-07-02 DIAGNOSIS — R279 Unspecified lack of coordination: Secondary | ICD-10-CM | POA: Diagnosis not present

## 2014-07-02 DIAGNOSIS — M6281 Muscle weakness (generalized): Secondary | ICD-10-CM | POA: Diagnosis not present

## 2014-07-02 DIAGNOSIS — Z9181 History of falling: Secondary | ICD-10-CM | POA: Diagnosis not present

## 2014-07-02 DIAGNOSIS — R488 Other symbolic dysfunctions: Secondary | ICD-10-CM | POA: Diagnosis not present

## 2014-07-03 DIAGNOSIS — M6281 Muscle weakness (generalized): Secondary | ICD-10-CM | POA: Diagnosis not present

## 2014-07-03 DIAGNOSIS — Z9181 History of falling: Secondary | ICD-10-CM | POA: Diagnosis not present

## 2014-07-03 DIAGNOSIS — R279 Unspecified lack of coordination: Secondary | ICD-10-CM | POA: Diagnosis not present

## 2014-07-03 DIAGNOSIS — R488 Other symbolic dysfunctions: Secondary | ICD-10-CM | POA: Diagnosis not present

## 2014-07-08 DIAGNOSIS — Z9181 History of falling: Secondary | ICD-10-CM | POA: Diagnosis not present

## 2014-07-08 DIAGNOSIS — R279 Unspecified lack of coordination: Secondary | ICD-10-CM | POA: Diagnosis not present

## 2014-07-08 DIAGNOSIS — R488 Other symbolic dysfunctions: Secondary | ICD-10-CM | POA: Diagnosis not present

## 2014-07-08 DIAGNOSIS — M6281 Muscle weakness (generalized): Secondary | ICD-10-CM | POA: Diagnosis not present

## 2014-07-09 DIAGNOSIS — R488 Other symbolic dysfunctions: Secondary | ICD-10-CM | POA: Diagnosis not present

## 2014-07-09 DIAGNOSIS — Z9181 History of falling: Secondary | ICD-10-CM | POA: Diagnosis not present

## 2014-07-09 DIAGNOSIS — M6281 Muscle weakness (generalized): Secondary | ICD-10-CM | POA: Diagnosis not present

## 2014-07-09 DIAGNOSIS — R279 Unspecified lack of coordination: Secondary | ICD-10-CM | POA: Diagnosis not present

## 2014-07-10 DIAGNOSIS — M6281 Muscle weakness (generalized): Secondary | ICD-10-CM | POA: Diagnosis not present

## 2014-07-10 DIAGNOSIS — Z9181 History of falling: Secondary | ICD-10-CM | POA: Diagnosis not present

## 2014-07-10 DIAGNOSIS — R279 Unspecified lack of coordination: Secondary | ICD-10-CM | POA: Diagnosis not present

## 2014-07-10 DIAGNOSIS — R488 Other symbolic dysfunctions: Secondary | ICD-10-CM | POA: Diagnosis not present

## 2014-07-14 DIAGNOSIS — Z9181 History of falling: Secondary | ICD-10-CM | POA: Diagnosis not present

## 2014-07-14 DIAGNOSIS — R488 Other symbolic dysfunctions: Secondary | ICD-10-CM | POA: Diagnosis not present

## 2014-07-14 DIAGNOSIS — M6281 Muscle weakness (generalized): Secondary | ICD-10-CM | POA: Diagnosis not present

## 2014-07-14 DIAGNOSIS — R279 Unspecified lack of coordination: Secondary | ICD-10-CM | POA: Diagnosis not present

## 2014-07-15 DIAGNOSIS — R488 Other symbolic dysfunctions: Secondary | ICD-10-CM | POA: Diagnosis not present

## 2014-07-17 DIAGNOSIS — R488 Other symbolic dysfunctions: Secondary | ICD-10-CM | POA: Diagnosis not present

## 2014-07-22 DIAGNOSIS — D643 Other sideroblastic anemias: Secondary | ICD-10-CM | POA: Diagnosis not present

## 2014-07-22 DIAGNOSIS — I10 Essential (primary) hypertension: Secondary | ICD-10-CM | POA: Diagnosis not present

## 2014-07-22 DIAGNOSIS — R488 Other symbolic dysfunctions: Secondary | ICD-10-CM | POA: Diagnosis not present

## 2014-07-22 DIAGNOSIS — R42 Dizziness and giddiness: Secondary | ICD-10-CM | POA: Diagnosis not present

## 2014-07-24 DIAGNOSIS — I10 Essential (primary) hypertension: Secondary | ICD-10-CM | POA: Diagnosis not present

## 2014-07-28 DIAGNOSIS — E039 Hypothyroidism, unspecified: Secondary | ICD-10-CM | POA: Diagnosis not present

## 2014-07-28 DIAGNOSIS — E871 Hypo-osmolality and hyponatremia: Secondary | ICD-10-CM | POA: Diagnosis not present

## 2014-07-28 DIAGNOSIS — E8771 Transfusion associated circulatory overload: Secondary | ICD-10-CM | POA: Diagnosis not present

## 2014-07-28 DIAGNOSIS — I10 Essential (primary) hypertension: Secondary | ICD-10-CM | POA: Diagnosis not present

## 2014-07-29 DIAGNOSIS — R488 Other symbolic dysfunctions: Secondary | ICD-10-CM | POA: Diagnosis not present

## 2014-07-31 DIAGNOSIS — R488 Other symbolic dysfunctions: Secondary | ICD-10-CM | POA: Diagnosis not present

## 2014-08-04 ENCOUNTER — Ambulatory Visit (INDEPENDENT_AMBULATORY_CARE_PROVIDER_SITE_OTHER): Payer: Medicare Other | Admitting: Podiatry

## 2014-08-04 DIAGNOSIS — B351 Tinea unguium: Secondary | ICD-10-CM

## 2014-08-04 DIAGNOSIS — M79609 Pain in unspecified limb: Secondary | ICD-10-CM

## 2014-08-04 DIAGNOSIS — M79673 Pain in unspecified foot: Secondary | ICD-10-CM

## 2014-08-05 DIAGNOSIS — E871 Hypo-osmolality and hyponatremia: Secondary | ICD-10-CM | POA: Diagnosis not present

## 2014-08-05 DIAGNOSIS — I10 Essential (primary) hypertension: Secondary | ICD-10-CM | POA: Diagnosis not present

## 2014-08-05 NOTE — Progress Notes (Signed)
Subjective:     Patient ID: [name], female   DOB: May 15, 1918, 80 y.o.   MRN: [id number]  HPI patient presents with thick nails 1-5 both feet that are painful and she cannot take care of   Review of Systems     Objective:   Physical Exam Neurovascular status unchanged with thick yellow brittle nailbeds 1-5 both feet    Assessment:     Mycotic nail infection with pain 1-5 both feet    Plan:     Debride painful nailbeds 1-5 both feet with no iatrogenic bleeding noted

## 2014-08-07 DIAGNOSIS — M25519 Pain in unspecified shoulder: Secondary | ICD-10-CM | POA: Diagnosis not present

## 2014-08-21 ENCOUNTER — Encounter: Payer: Self-pay | Admitting: Internal Medicine

## 2014-08-29 DIAGNOSIS — R296 Repeated falls: Secondary | ICD-10-CM | POA: Diagnosis not present

## 2014-08-29 DIAGNOSIS — Z23 Encounter for immunization: Secondary | ICD-10-CM | POA: Diagnosis not present

## 2014-08-29 DIAGNOSIS — R2681 Unsteadiness on feet: Secondary | ICD-10-CM | POA: Diagnosis not present

## 2014-08-29 DIAGNOSIS — Z9181 History of falling: Secondary | ICD-10-CM | POA: Diagnosis not present

## 2014-09-02 DIAGNOSIS — R2681 Unsteadiness on feet: Secondary | ICD-10-CM | POA: Diagnosis not present

## 2014-09-02 DIAGNOSIS — Z9181 History of falling: Secondary | ICD-10-CM | POA: Diagnosis not present

## 2014-09-02 DIAGNOSIS — R296 Repeated falls: Secondary | ICD-10-CM | POA: Diagnosis not present

## 2014-09-05 DIAGNOSIS — R296 Repeated falls: Secondary | ICD-10-CM | POA: Diagnosis not present

## 2014-09-05 DIAGNOSIS — R2681 Unsteadiness on feet: Secondary | ICD-10-CM | POA: Diagnosis not present

## 2014-09-05 DIAGNOSIS — Z9181 History of falling: Secondary | ICD-10-CM | POA: Diagnosis not present

## 2014-09-11 DIAGNOSIS — R296 Repeated falls: Secondary | ICD-10-CM | POA: Diagnosis not present

## 2014-09-11 DIAGNOSIS — Z9181 History of falling: Secondary | ICD-10-CM | POA: Diagnosis not present

## 2014-09-11 DIAGNOSIS — R2681 Unsteadiness on feet: Secondary | ICD-10-CM | POA: Diagnosis not present

## 2014-10-01 DIAGNOSIS — N390 Urinary tract infection, site not specified: Secondary | ICD-10-CM | POA: Diagnosis not present

## 2014-10-02 ENCOUNTER — Emergency Department (HOSPITAL_COMMUNITY)
Admission: EM | Admit: 2014-10-02 | Discharge: 2014-10-02 | Disposition: A | Discharge status: Home / Self Care | Payer: Medicare Other | Attending: Emergency Medicine | Admitting: Emergency Medicine

## 2014-10-02 ENCOUNTER — Telehealth: Payer: Self-pay | Admitting: *Deleted

## 2014-10-02 ENCOUNTER — Encounter (HOSPITAL_COMMUNITY): Payer: Self-pay | Admitting: Emergency Medicine

## 2014-10-02 DIAGNOSIS — E039 Hypothyroidism, unspecified: Secondary | ICD-10-CM | POA: Diagnosis not present

## 2014-10-02 DIAGNOSIS — R112 Nausea with vomiting, unspecified: Secondary | ICD-10-CM

## 2014-10-02 DIAGNOSIS — H409 Unspecified glaucoma: Secondary | ICD-10-CM | POA: Diagnosis not present

## 2014-10-02 DIAGNOSIS — E785 Hyperlipidemia, unspecified: Secondary | ICD-10-CM | POA: Diagnosis not present

## 2014-10-02 DIAGNOSIS — E119 Type 2 diabetes mellitus without complications: Secondary | ICD-10-CM | POA: Diagnosis not present

## 2014-10-02 DIAGNOSIS — M1990 Unspecified osteoarthritis, unspecified site: Secondary | ICD-10-CM | POA: Insufficient documentation

## 2014-10-02 DIAGNOSIS — Z79899 Other long term (current) drug therapy: Secondary | ICD-10-CM | POA: Insufficient documentation

## 2014-10-02 DIAGNOSIS — I959 Hypotension, unspecified: Secondary | ICD-10-CM | POA: Diagnosis present

## 2014-10-02 DIAGNOSIS — E871 Hypo-osmolality and hyponatremia: Secondary | ICD-10-CM | POA: Insufficient documentation

## 2014-10-02 DIAGNOSIS — R031 Nonspecific low blood-pressure reading: Secondary | ICD-10-CM | POA: Diagnosis not present

## 2014-10-02 DIAGNOSIS — Z862 Personal history of diseases of the blood and blood-forming organs and certain disorders involving the immune mechanism: Secondary | ICD-10-CM | POA: Diagnosis not present

## 2014-10-02 DIAGNOSIS — K219 Gastro-esophageal reflux disease without esophagitis: Secondary | ICD-10-CM | POA: Diagnosis not present

## 2014-10-02 LAB — CBC WITH DIFFERENTIAL/PLATELET
Basophils Absolute: 0 10*3/uL (ref 0.0–0.1)
Basophils Relative: 0 % (ref 0–1)
EOS PCT: 2 % (ref 0–5)
Eosinophils Absolute: 0.2 10*3/uL (ref 0.0–0.7)
HEMATOCRIT: 32.5 % — AB (ref 36.0–46.0)
Hemoglobin: 10.7 g/dL — ABNORMAL LOW (ref 12.0–15.0)
LYMPHS ABS: 2.2 10*3/uL (ref 0.7–4.0)
LYMPHS PCT: 13 % (ref 12–46)
MCH: 29.1 pg (ref 26.0–34.0)
MCHC: 32.9 g/dL (ref 30.0–36.0)
MCV: 88.3 fL (ref 78.0–100.0)
MONO ABS: 1.4 10*3/uL — AB (ref 0.1–1.0)
Monocytes Relative: 8 % (ref 3–12)
Neutro Abs: 12.7 10*3/uL — ABNORMAL HIGH (ref 1.7–7.7)
Neutrophils Relative %: 77 % (ref 43–77)
PLATELETS: 274 10*3/uL (ref 150–400)
RBC: 3.68 MIL/uL — AB (ref 3.87–5.11)
RDW: 13.5 % (ref 11.5–15.5)
WBC: 16.6 10*3/uL — AB (ref 4.0–10.5)

## 2014-10-02 LAB — COMPREHENSIVE METABOLIC PANEL
ALBUMIN: 3.4 g/dL — AB (ref 3.5–5.2)
ALT: 13 U/L (ref 0–35)
AST: 23 U/L (ref 0–37)
Alkaline Phosphatase: 84 U/L (ref 39–117)
Anion gap: 12 (ref 5–15)
BUN: 20 mg/dL (ref 6–23)
CALCIUM: 9 mg/dL (ref 8.4–10.5)
CO2: 27 mEq/L (ref 19–32)
CREATININE: 1.02 mg/dL (ref 0.50–1.10)
Chloride: 89 mEq/L — ABNORMAL LOW (ref 96–112)
GFR calc Af Amer: 53 mL/min — ABNORMAL LOW (ref >90)
GFR calc non Af Amer: 46 mL/min — ABNORMAL LOW (ref >90)
Glucose, Bld: 114 mg/dL — ABNORMAL HIGH (ref 70–99)
Potassium: 4.4 mEq/L (ref 3.7–5.3)
Sodium: 128 mEq/L — ABNORMAL LOW (ref 137–147)
TOTAL PROTEIN: 6.9 g/dL (ref 6.0–8.3)
Total Bilirubin: 0.3 mg/dL (ref 0.3–1.2)

## 2014-10-02 MED ORDER — SODIUM CHLORIDE 0.9 % IV BOLUS (SEPSIS)
500.0000 mL | Freq: Once | INTRAVENOUS | Status: AC
  Administered 2014-10-02: 500 mL via INTRAVENOUS

## 2014-10-02 NOTE — Telephone Encounter (Signed)
Beth nurse at [facility] called and stated that Patient [name] is 70/45 and color is gray, very weak and vomiting and BM is loose. I Princella Pellegrini and she advised patient to go to ER. Beth Agreed and will send.

## 2014-10-02 NOTE — ED Notes (Signed)
Bed: WA07 Expected date:  Expected time:  Means of arrival:  Comments: EMS, hypotensive, now not

## 2014-10-02 NOTE — Discharge Instructions (Signed)
Hyponatremia ° Hyponatremia is when the salt (sodium) in your blood is low. When salt becomes low, your cells take in extra water and puff up (swell). The puffiness can happen in the whole body. It mostly affects the brain and is very serious.  °HOME CARE °· Only take medicine as told by your doctor. °· Follow any diet instructions you were given. This includes limiting how much fluid you drink. °· Keep all doctor visits for tests as told. °· Avoid alcohol and drugs. °GET HELP RIGHT AWAY IF: °· You start to twitch and shake (seize). °· You pass out (faint). °· You continue to have watery poop (diarrhea) or you throw up (vomit). °· You feel sick to your stomach (nauseous). °· You are tired (fatigued), have a headache, are confused, or feel weak. °· Your problems that first brought you to the doctor come back. °· You have trouble following your diet instructions. °MAKE SURE YOU:  °· Understand these instructions. °· Will watch your condition. °· Will get help right away if you are not doing well or get worse. °Document Released: 07/13/2011 Document Revised: 01/23/2012 Document Reviewed: 07/13/2011 °ExitCare® Patient Information ©2015 [employer]. This information is not intended to replace advice given to you by your health care provider. Make sure you discuss any questions you have with your health care provider. ° °

## 2014-10-02 NOTE — ED Notes (Signed)
Had an episode of loose stool and vomiting, felt weak and called EMS. After standing and sitting BP 80/40, while lying supine pressure 118/70. Not hypotensive at this time. After 250 ml NS, BP 140/70 semi-fowlers position

## 2014-10-08 ENCOUNTER — Encounter: Payer: Self-pay | Admitting: Nurse Practitioner

## 2014-10-08 NOTE — ED Provider Notes (Signed)
CSN: [id number]     Arrival date & time 10/02/14  1816 History   First MD Initiated Contact with Patient 10/02/14 1829     Chief Complaint  Patient presents with  . Hypotension      HPI Had an episode of loose stool and vomiting, felt weak and called EMS. After standing and sitting BP 80/40, while lying supine pressure 118/70. Not hypotensive at this time. After 250 ml NS, BP 140/70 semi-fowlers position Past Medical History  Diagnosis Date  . GERD (gastroesophageal reflux disease)   . IBS (irritable bowel syndrome)   . Glaucoma   . Hemorrhoids   . Anxiety and depression   . Diverticulosis   . HLD (hyperlipidemia)   . Hypothyroidism   . Arthritis   . Anemia, unspecified   . Diabetes mellitus without complication   . Palpitations    Past Surgical History  Procedure Laterality Date  . Appendectomy    . Tonsillectomy    . Colonoscopy  04/15/10    diverticulosis, sigmoid AVM, hemorrhoids   Family History  Problem Relation Age of Onset  . Colon cancer Father 30  . Diabetes Mother   . Colon cancer Mother   . Heart attack Brother   . Parkinsonism Brother   . Colon cancer Paternal Uncle   . Colon cancer Paternal Grandfather   . CVA Sister    History  Substance Use Topics  . Smoking status: Never Smoker   . Smokeless tobacco: Never Used  . Alcohol Use: No   OB History    No data available     Review of Systems  All other systems reviewed and are negative.     Allergies  Iohexol and Naprosyn  Home Medications   Prior to Admission medications   Medication Sig Start Date End Date Taking? Authorizing Provider  bimatoprost (LUMIGAN) 0.01 % SOLN Place 1 drop into both eyes at bedtime.    Yes Historical Provider, MD  buPROPion (WELLBUTRIN XL) 300 MG 24 hr tablet Take 300 mg by mouth daily.   Yes Historical Provider, MD  cetirizine (ZYRTEC) 10 MG tablet Take 10 mg by mouth at bedtime.   Yes Historical Provider, MD  Cholecalciferol ([employer] VITAMIN D) 2000 UNITS CAPS  Take 1 capsule by mouth daily. (vitamin d)   Yes Historical Provider, MD  dorzolamide-timolol (COSOPT) 22.3-6.8 MG/ML ophthalmic solution Place 1 drop into both eyes every morning.    Yes Historical Provider, MD  GLIMEPIRIDE PO Take 0.5 mg by mouth daily.   Yes Historical Provider, MD  hydrochlorothiazide (MICROZIDE) 12.5 MG capsule Take 12.5 mg by mouth daily.  09/02/13  Yes Historical Provider, MD  ibuprofen (ADVIL,MOTRIN) 200 MG tablet Take 200 mg by mouth every 4 (four) hours as needed for moderate pain (pain).   Yes Historical Provider, MD  levothyroxine (SYNTHROID, LEVOTHROID) 25 MCG tablet Take 37.5 mcg by mouth daily.    Yes Historical Provider, MD  Lutein 20 MG CAPS Take 1 capsule by mouth every morning.   Yes Historical Provider, MD  mirtazapine (REMERON) 7.5 MG tablet Take 7.5 mg by mouth at bedtime.   Yes Historical Provider, MD  omeprazole (PRILOSEC) 20 MG capsule Take 20 mg by mouth 2 (two) times daily.   Yes Historical Provider, MD  RESTASIS 0.05 % ophthalmic emulsion Place 1 drop into both eyes every 12 (twelve) hours.  09/05/13  Yes Historical Provider, MD  sertraline (ZOLOFT) 25 MG tablet Take 25 mg by mouth daily.  09/20/13  Yes Historical Provider,  MD  acetaminophen (TYLENOL) 325 MG tablet Take 650 mg by mouth every 6 (six) hours as needed. For pain.    Historical Provider, MD  Dextromethorphan-Guaifenesin 10-100 MG/5ML liquid Take 5 mLs by mouth every 4 (four) hours as needed (cough).    Historical Provider, MD  dicyclomine (BENTYL) 10 MG capsule Take 10 mg by mouth 2 (two) times daily as needed for spasms.  07/29/13   Historical Provider, MD  Polyethyl Glycol-Propyl Glycol (SYSTANE) 0.4-0.3 % SOLN Apply 1-2 drops to eye every hour as needed.    Historical Provider, MD  polyethylene glycol powder (GLYCOLAX/MIRALAX) powder Take 1 Container by mouth 2 (two) times daily as needed for mild constipation.  08/09/13   Historical Provider, MD   BP 165/74 mmHg  Pulse 75  Temp(Src) 98.2 F  (36.8 C) (Oral)  Resp 12  SpO2 98% Physical Exam  Constitutional: She is oriented to person, place, and time. She appears well-developed and well-nourished. No distress.  HENT:  Head: Normocephalic and atraumatic.  Eyes: Pupils are equal, round, and reactive to light.  Neck: Normal range of motion.  Cardiovascular: Normal rate and intact distal pulses.   Pulmonary/Chest: No respiratory distress.  Abdominal: Normal appearance. She exhibits no distension. There is no tenderness. There is no rebound and no guarding.  Musculoskeletal: Normal range of motion.  Neurological: She is alert and oriented to person, place, and time. No cranial nerve deficit.  Skin: Skin is warm and dry. No rash noted.  Psychiatric: She has a normal mood and affect. Her behavior is normal.  Nursing note and vitals reviewed.   ED Course  Procedures (including critical care time) Labs Review Labs Reviewed  COMPREHENSIVE METABOLIC PANEL - Abnormal; Notable for the following:    Sodium 128 (*)    Chloride 89 (*)    Glucose, Bld 114 (*)    Albumin 3.4 (*)    GFR calc non Af Amer 46 (*)    GFR calc Af Amer 53 (*)    All other components within normal limits  CBC WITH DIFFERENTIAL - Abnormal; Notable for the following:    WBC 16.6 (*)    RBC 3.68 (*)    Hemoglobin 10.7 (*)    HCT 32.5 (*)    Neutro Abs 12.7 (*)    Monocytes Absolute 1.4 (*)    All other components within normal limits    Imaging Review No results found.  I wanted to admit patient because of hyponatremia but her son and her were insistant on going home.  Gave fluids. No more vomiting in ED After treatment in the ED the patient feels back to baseline and wants to go home.  MDM   Final diagnoses:  Non-intractable vomiting with nausea, vomiting of unspecified type  Hyponatremia        [name] L Bryker Fletchall, MD 10/08/14 2215

## 2014-10-22 ENCOUNTER — Non-Acute Institutional Stay: Payer: Medicare Other | Admitting: Nurse Practitioner

## 2014-10-22 ENCOUNTER — Encounter: Payer: Self-pay | Admitting: Nurse Practitioner

## 2014-10-22 VITALS — BP 132/66 | HR 88 | Temp 97.9°F | Ht 59.5 in | Wt 122.0 lb

## 2014-10-22 DIAGNOSIS — M19012 Primary osteoarthritis, left shoulder: Secondary | ICD-10-CM | POA: Diagnosis not present

## 2014-10-22 DIAGNOSIS — R278 Other lack of coordination: Secondary | ICD-10-CM | POA: Diagnosis not present

## 2014-10-22 DIAGNOSIS — G4700 Insomnia, unspecified: Secondary | ICD-10-CM | POA: Diagnosis not present

## 2014-10-22 DIAGNOSIS — M6281 Muscle weakness (generalized): Secondary | ICD-10-CM | POA: Diagnosis not present

## 2014-10-22 DIAGNOSIS — F0390 Unspecified dementia without behavioral disturbance: Secondary | ICD-10-CM | POA: Diagnosis not present

## 2014-10-22 DIAGNOSIS — M25519 Pain in unspecified shoulder: Secondary | ICD-10-CM | POA: Diagnosis not present

## 2014-10-22 DIAGNOSIS — K219 Gastro-esophageal reflux disease without esophagitis: Secondary | ICD-10-CM | POA: Diagnosis not present

## 2014-10-22 DIAGNOSIS — E039 Hypothyroidism, unspecified: Secondary | ICD-10-CM

## 2014-10-22 DIAGNOSIS — F329 Major depressive disorder, single episode, unspecified: Secondary | ICD-10-CM | POA: Diagnosis not present

## 2014-10-22 DIAGNOSIS — M19011 Primary osteoarthritis, right shoulder: Secondary | ICD-10-CM | POA: Diagnosis not present

## 2014-10-22 DIAGNOSIS — Z9181 History of falling: Secondary | ICD-10-CM | POA: Diagnosis not present

## 2014-10-22 DIAGNOSIS — M24811 Other specific joint derangements of right shoulder, not elsewhere classified: Secondary | ICD-10-CM | POA: Diagnosis not present

## 2014-10-22 DIAGNOSIS — M25512 Pain in left shoulder: Secondary | ICD-10-CM | POA: Diagnosis not present

## 2014-10-22 DIAGNOSIS — R279 Unspecified lack of coordination: Secondary | ICD-10-CM | POA: Diagnosis not present

## 2014-10-22 DIAGNOSIS — F32A Depression, unspecified: Secondary | ICD-10-CM

## 2014-10-22 DIAGNOSIS — M24812 Other specific joint derangements of left shoulder, not elsewhere classified: Secondary | ICD-10-CM | POA: Diagnosis not present

## 2014-10-22 DIAGNOSIS — R2681 Unsteadiness on feet: Secondary | ICD-10-CM | POA: Diagnosis not present

## 2014-10-22 DIAGNOSIS — M25511 Pain in right shoulder: Secondary | ICD-10-CM | POA: Diagnosis not present

## 2014-10-22 NOTE — Progress Notes (Addendum)
Patient ID: [name], female   DOB: [birth year], 80 y.o.   MRN: [id number]    Nursing Home Location:  [facility]   Place of Service: Clinic (12)  PCP: Kimber Relic, MD  Allergies  Allergen Reactions  . Iohexol     IV Dye  . Naprosyn [Naproxen]     Chief Complaint  Patient presents with  . Medical Management of Chronic Issues    New Patient: switching doctors from Dr. Leanor Rubenstein to [facility]. blood pressure, thyroid, depression.  . Shoulder Pain    both, trouble getting up, Seen Orthro got injections didn't help.  Here with son Shon Hale    HPI:  Patient is a 80 y.o. female seen today at [facility] to [facility]. previously being followed by Dr Broadus John but now requesting to be transferred to [facility] services. Hx provided by son. Husband pasted away 3 years ago and then she moved in [place] since.  Overall she has been healthy her whole life. Previously using a walker. Has had a few minor falls since she has been at [facility]. She is having increased pain in both of her shoulders (effects her walking because she is unable to use her walker) Son reports mood has been low as well. Increased depression. Recently medications were adjusted. Now on cymbalta which was started on Sep 23, 2014.  Was seeing ortho for shoulder pain and was having injections which does not help.  Does not sleep at night. But stays in her apartment all day without doing any activities and takes frequent naps  Review of Systems:  Review of Systems  Constitutional: Positive for fatigue and unexpected weight change (some weight loss). Negative for activity change and appetite change.  HENT: Negative for congestion and hearing loss.   Eyes: Negative.   Respiratory: Negative for cough and shortness of breath.   Cardiovascular: Negative for chest pain, palpitations and leg swelling.  Gastrointestinal: Negative for abdominal pain, diarrhea and constipation.    Genitourinary: Negative for dysuria and difficulty urinating.       Some episodes of incontinence   Musculoskeletal: Positive for arthralgias (shoulder pain). Negative for myalgias.  Skin: Negative for color change and wound.  Neurological: Negative for dizziness and weakness.  Psychiatric/Behavioral: Positive for confusion. Negative for behavioral problems and agitation.    Past Medical History  Diagnosis Date  . GERD (gastroesophageal reflux disease)   . IBS (irritable bowel syndrome)   . Glaucoma   . Hemorrhoids   . Anxiety and depression   . Diverticulosis   . HLD (hyperlipidemia)   . Hypothyroidism   . Arthritis   . Anemia, unspecified   . Diabetes mellitus without complication   . Palpitations    Past Surgical History  Procedure Laterality Date  . Appendectomy    . Tonsillectomy    . Colonoscopy  04/15/10    diverticulosis, sigmoid AVM, hemorrhoids   Social History:   reports that she has never smoked. She has never used smokeless tobacco. She reports that she does not drink alcohol or use illicit drugs.  Family History  Problem Relation Age of Onset  . Colon cancer Father 81  . Diabetes Mother   . Colon cancer Mother   . Heart attack Brother   . Parkinsonism Brother   . Colon cancer Paternal Uncle   . Colon cancer Paternal Grandfather   . CVA Sister     Medications: Patient's Medications  New Prescriptions   No medications on  file  Previous Medications   ACETAMINOPHEN (TYLENOL) 325 MG TABLET    Take 650 mg by mouth every 6 (six) hours as needed. For pain.   BIMATOPROST (LUMIGAN) 0.01 % SOLN    Place 1 drop into both eyes at bedtime.    CHOLECALCIFEROL ([employer]S VITAMIN D) 2000 UNITS CAPS    Take 1 capsule by mouth daily. (vitamin d)   DEXTROMETHORPHAN-GUAIFENESIN 10-100 MG/5ML LIQUID    Take 5 mLs by mouth every 4 (four) hours as needed (cough).   DORZOLAMIDE-TIMOLOL (COSOPT) 22.3-6.8 MG/ML OPHTHALMIC SOLUTION    Place 1 drop into both eyes every morning.     DULOXETINE (CYMBALTA) 30 MG CAPSULE    Take 30 mg by mouth daily. Take one tablet daily   GLIMEPIRIDE PO    Take 0.5 mg by mouth daily.   IBUPROFEN (ADVIL,MOTRIN) 200 MG TABLET    Take 200 mg by mouth every 4 (four) hours as needed for moderate pain (pain).   LEVOTHYROXINE (SYNTHROID, LEVOTHROID) 25 MCG TABLET    Take 37.5 mcg by mouth daily.    LUTEIN 20 MG CAPS    Take 1 capsule by mouth every morning.   POLYETHYL GLYCOL-PROPYL GLYCOL (SYSTANE) 0.4-0.3 % SOLN    Apply 1-2 drops to eye every hour as needed.   POLYETHYLENE GLYCOL POWDER (GLYCOLAX/MIRALAX) POWDER    Take 1 Container by mouth 2 (two) times daily as needed for mild constipation.    RESTASIS 0.05 % OPHTHALMIC EMULSION    Place 1 drop into both eyes every 12 (twelve) hours.    TEMAZEPAM (RESTORIL) 15 MG CAPSULE    Take one at bedtime  Modified Medications   No medications on file  Discontinued Medications   BUPROPION (WELLBUTRIN XL) 300 MG 24 HR TABLET    Take 300 mg by mouth daily.   CETIRIZINE (ZYRTEC) 10 MG TABLET    Take 10 mg by mouth at bedtime.   DICYCLOMINE (BENTYL) 10 MG CAPSULE    Take 10 mg by mouth 2 (two) times daily as needed for spasms.    HYDROCHLOROTHIAZIDE (MICROZIDE) 12.5 MG CAPSULE    Take 12.5 mg by mouth daily.    MIRTAZAPINE (REMERON) 7.5 MG TABLET    Take 7.5 mg by mouth at bedtime.   OMEPRAZOLE (PRILOSEC) 20 MG CAPSULE    Take 20 mg by mouth 2 (two) times daily.   SERTRALINE (ZOLOFT) 25 MG TABLET    Take 25 mg by mouth daily.      Physical Exam: Filed Vitals:   10/22/14 1453  BP: 132/66  Pulse: 88  Temp: 97.9 F (36.6 C)  TempSrc: Oral  Height: 4' 11.5" (1.511 m)  Weight: 122 lb (55.339 kg)  SpO2: 99%    Physical Exam  Constitutional: She appears well-developed and well-nourished. No distress.  HENT:  Head: Normocephalic and atraumatic.  Mouth/Throat: Oropharynx is clear and moist. No oropharyngeal exudate.  Eyes: Conjunctivae are normal. Pupils are equal, round, and reactive to light.    Neck: Normal range of motion. Neck supple.  Cardiovascular: Normal rate, regular rhythm and normal heart sounds.   Pulmonary/Chest: Effort normal and breath sounds normal.  Abdominal: Soft. Bowel sounds are normal.  Musculoskeletal: She exhibits tenderness (with decrease ROM to bilateral shoulders). She exhibits no edema.  Neurological: She is alert.  Skin: Skin is warm and dry. She is not diaphoretic.  Psychiatric: She has a normal mood and affect.    Labs reviewed: Basic Metabolic Panel:  Recent Labs  26/83/41 1928  NA 128*  K 4.4  CL 89*  CO2 27  GLUCOSE 114*  BUN 20  CREATININE 1.02  CALCIUM 9.0   Liver Function Tests:  Recent Labs  10/02/14 1928  AST 23  ALT 13  ALKPHOS 84  BILITOT 0.3  PROT 6.9  ALBUMIN 3.4*   No results for input(s): LIPASE, AMYLASE in the last 8760 hours. No results for input(s): AMMONIA in the last 8760 hours. CBC:  Recent Labs  10/02/14 1928  WBC 16.6*  NEUTROABS 12.7*  HGB 10.7*  HCT 32.5*  MCV 88.3  PLT 274   TSH: No results for input(s): TSH in the last 8760 hours. A1C: No results found for: HGBA1C Lipid Panel: No results for input(s): CHOL, HDL, LDLCALC, TRIG, CHOLHDL, LDLDIRECT in the last 8760 hours.    Assessment/Plan  1. Dementia, without behavioral disturbance -does have noted memory loss, will need to get MMSE on next visit, not currently on any medications. No significant changes in cognitive or functional status - DNR (Do Not Resuscitate) also will have staff help family fill out MOST form with pt.   2. Hypothyroidism, unspecified hypothyroidism type Will follow up TSH before next visit, conts on synthroid 37.5 mcgs   3. Depression Obvious depression. Does not feel the need to leave room or engage with other residents. Has sleep disturbance but is not receptive to recommendations -recent medications change to Cymbalta. Will cont this and monitor at this time  4. Gastroesophageal reflux disease,  esophagitis presence not specified Off all medications at this time. Will cont to monitor   5. Insomnia -recommendation to be more involved in activities, get out of room more often, increase exercise. Pt does not seem to like recommendation. Son will try to encourage and get pt out of room more.   6. OA of the shoulder -injections and advil not effective -discussed pain management. Will start with ultram 50 mg 1-2 tablets q 6 hours PRN -also pt having a lot of trouble sleeping and getting repositioned in regular bed. Staff and family have tired repositioning with pillows and comfort is unable to be obtained. This resident needs a hospital bed because their medical conditions requires positioning of the bed in ways not feasible with ordinary bed. pillows and wedges have been considered and will not allow the correct positioning for the patient. This resident requires frequent changes in their positioning due to physical condition and a hospital bed will alleviate this problem. Will order hospital bed at this time.

## 2014-10-23 DIAGNOSIS — M19011 Primary osteoarthritis, right shoulder: Secondary | ICD-10-CM | POA: Diagnosis not present

## 2014-10-23 DIAGNOSIS — M25519 Pain in unspecified shoulder: Secondary | ICD-10-CM | POA: Diagnosis not present

## 2014-10-23 DIAGNOSIS — M19012 Primary osteoarthritis, left shoulder: Secondary | ICD-10-CM | POA: Diagnosis not present

## 2014-10-23 DIAGNOSIS — R278 Other lack of coordination: Secondary | ICD-10-CM | POA: Diagnosis not present

## 2014-10-23 DIAGNOSIS — R279 Unspecified lack of coordination: Secondary | ICD-10-CM | POA: Diagnosis not present

## 2014-10-23 DIAGNOSIS — M6281 Muscle weakness (generalized): Secondary | ICD-10-CM | POA: Diagnosis not present

## 2014-10-24 DIAGNOSIS — M19011 Primary osteoarthritis, right shoulder: Secondary | ICD-10-CM | POA: Diagnosis not present

## 2014-10-24 DIAGNOSIS — M25519 Pain in unspecified shoulder: Secondary | ICD-10-CM | POA: Diagnosis not present

## 2014-10-24 DIAGNOSIS — R278 Other lack of coordination: Secondary | ICD-10-CM | POA: Diagnosis not present

## 2014-10-24 DIAGNOSIS — M19012 Primary osteoarthritis, left shoulder: Secondary | ICD-10-CM | POA: Diagnosis not present

## 2014-10-24 DIAGNOSIS — R279 Unspecified lack of coordination: Secondary | ICD-10-CM | POA: Diagnosis not present

## 2014-10-24 DIAGNOSIS — M6281 Muscle weakness (generalized): Secondary | ICD-10-CM | POA: Diagnosis not present

## 2014-10-27 ENCOUNTER — Non-Acute Institutional Stay (SKILLED_NURSING_FACILITY): Payer: Medicare Other | Admitting: Adult Health

## 2014-10-27 DIAGNOSIS — M25519 Pain in unspecified shoulder: Secondary | ICD-10-CM | POA: Diagnosis not present

## 2014-10-27 DIAGNOSIS — M19011 Primary osteoarthritis, right shoulder: Secondary | ICD-10-CM | POA: Diagnosis not present

## 2014-10-27 DIAGNOSIS — M19012 Primary osteoarthritis, left shoulder: Secondary | ICD-10-CM | POA: Diagnosis not present

## 2014-10-27 DIAGNOSIS — R278 Other lack of coordination: Secondary | ICD-10-CM | POA: Diagnosis not present

## 2014-10-27 DIAGNOSIS — R279 Unspecified lack of coordination: Secondary | ICD-10-CM | POA: Diagnosis not present

## 2014-10-27 DIAGNOSIS — M19019 Primary osteoarthritis, unspecified shoulder: Secondary | ICD-10-CM | POA: Insufficient documentation

## 2014-10-27 DIAGNOSIS — M6281 Muscle weakness (generalized): Secondary | ICD-10-CM | POA: Diagnosis not present

## 2014-10-27 NOTE — Assessment & Plan Note (Signed)
Her pain is not controlled at this point. The fentanyl patch will be in to start tonight. I have decreased the ultram to 1 tab BID prn pain due to the fact she is too groggy with two pills and more confused. I discussed with her son her allergy list and he stated that she has had ibuprofen in the past without side effect. So we Ibuprofen 200mg  2 tabs TID for 5 days then prn pain. At that point the fentanyl patch will have hopefully reached its full effect.

## 2014-10-27 NOTE — Progress Notes (Signed)
Patient ID: [name], female   DOB: 09/17/1918, 80 y.o.   MRN: [id number]  Nursing Home Location:  [facility]   Code Status: DNR   Place of Service: SNF (31)  Chief Complaint  Patient presents with  . Acute Visit    increased confusion    HPI:  80 y.[facility]. [facility]. I was asked to see her today due to increased grogginess and confusion after starting on ultram for shoulder pain. She has been followed by Dr. Thurston HoleWainer with ortho but these records are not available for review. She was placed on Fentanyl for pain on Friday but the patch has not come in yet. Since using the ultram the family feels that she is more groggy and confused. She has not had a fever, or other symptoms and is eating well per the staff. Her shoulder pain is a long standing issue related to OA.   Review of Systems:  Review of Systems  Constitutional: Negative for fever, activity change, appetite change and fatigue.  HENT: Negative for congestion, rhinorrhea and sore throat.   Gastrointestinal: Negative for nausea, abdominal pain, diarrhea, constipation and abdominal distention.  Genitourinary: Negative for dysuria and difficulty urinating.  Musculoskeletal: Positive for joint swelling and arthralgias. Negative for myalgias, back pain and gait problem.  Neurological: Negative for dizziness, seizures, facial asymmetry, speech difficulty and light-headedness.  Psychiatric/Behavioral: Positive for confusion. Negative for hallucinations, behavioral problems and agitation. The patient is not nervous/anxious.     Medications: Patient's Medications  New Prescriptions   No medications on file  Previous Medications   ACETAMINOPHEN (TYLENOL) 325 MG TABLET    Take 650 mg by mouth every 6 (six) hours as needed. For pain.   BIMATOPROST (LUMIGAN) 0.01 % SOLN    Place 1 drop into both eyes at bedtime.    CHOLECALCIFEROL ([employer] VITAMIN D) 2000  UNITS CAPS    Take 1 capsule by mouth daily. (vitamin d)   DEXTROMETHORPHAN-GUAIFENESIN 10-100 MG/5ML LIQUID    Take 5 mLs by mouth every 4 (four) hours as needed (cough).   DORZOLAMIDE-TIMOLOL (COSOPT) 22.3-6.8 MG/ML OPHTHALMIC SOLUTION    Place 1 drop into both eyes every morning.    DULOXETINE (CYMBALTA) 30 MG CAPSULE    Take 30 mg by mouth daily. Take one tablet daily   GLIMEPIRIDE PO    Take 0.5 mg by mouth daily.   IBUPROFEN (ADVIL,MOTRIN) 200 MG TABLET    Take 200 mg by mouth every 4 (four) hours as needed for moderate pain (pain).   LEVOTHYROXINE (SYNTHROID, LEVOTHROID) 25 MCG TABLET    Take 37.5 mcg by mouth daily.    LUTEIN 20 MG CAPS    Take 1 capsule by mouth every morning.   POLYETHYL GLYCOL-PROPYL GLYCOL (SYSTANE) 0.4-0.3 % SOLN    Apply 1-2 drops to eye every hour as needed.   POLYETHYLENE GLYCOL POWDER (GLYCOLAX/MIRALAX) POWDER    Take 1 Container by mouth 2 (two) times daily as needed for mild constipation.    RESTASIS 0.05 % OPHTHALMIC EMULSION    Place 1 drop into both eyes every 12 (twelve) hours.    TEMAZEPAM (RESTORIL) 15 MG CAPSULE    Take one at bedtime  Modified Medications   No medications on file  Discontinued Medications   No medications on file     Physical Exam:  Filed Vitals:   10/27/14 1218  BP: 104/63  Pulse: 74  Temp: 97.6 F (36.4 C)  Resp: 12  SpO2: 94%    Physical Exam  Constitutional: No distress.  HENT:  Head: Normocephalic and atraumatic.  Neck: No JVD present. No tracheal deviation present.  Cardiovascular: Normal rate, regular rhythm and normal heart sounds.   No murmur heard. Pulmonary/Chest: Effort normal and breath sounds normal. No respiratory distress. She has no wheezes. She has no rales.  Abdominal: Soft. Bowel sounds are normal. She exhibits no distension. There is no tenderness.  Musculoskeletal: She exhibits edema and tenderness.  Decreased ROM to both shoulder and resistance to exam due to pain. Swelling noted to the right  shoulder with some tenderness. Pain with any movement noted.  Lymphadenopathy:    She has no cervical adenopathy.  Neurological: She is alert. No cranial nerve deficit.  Oriented x2  Skin: Skin is warm and dry. No rash noted. She is not diaphoretic. No erythema.  Psychiatric: Affect normal.    Labs reviewed/Significant Diagnostic Results: No recent xray for review. Need ortho records.  Basic Metabolic Panel:  Recent Labs  87/56/43 1928  NA 128*  K 4.4  CL 89*  CO2 27  GLUCOSE 114*  BUN 20  CREATININE 1.02  CALCIUM 9.0   Liver Function Tests:  Recent Labs  10/02/14 1928  AST 23  ALT 13  ALKPHOS 84  BILITOT 0.3  PROT 6.9  ALBUMIN 3.4*   No results for input(s): LIPASE, AMYLASE in the last 8760 hours. No results for input(s): AMMONIA in the last 8760 hours. CBC:  Recent Labs  10/02/14 1928  WBC 16.6*  NEUTROABS 12.7*  HGB 10.7*  HCT 32.5*  MCV 88.3  PLT 274     Assessment/Plan  Osteoarthritis, shoulder Her pain is not controlled at this point. The fentanyl patch will be in to start tonight. I have decreased the ultram to 1 tab BID prn pain due to the fact she is too groggy with two pills and more confused. I discussed with her son her allergy list and he stated that she has had ibuprofen in the past without side effect. So we Ibuprofen 200mg  2 tabs TID for 5 days then prn pain. At that point the fentanyl patch will have hopefully reached its full effect.   I spent 40 min in review of the records, talking with the resident, POA, and nurse.   Peggye Ley, ANP [facility] [phone]

## 2014-10-29 DIAGNOSIS — R279 Unspecified lack of coordination: Secondary | ICD-10-CM | POA: Diagnosis not present

## 2014-10-29 DIAGNOSIS — M6281 Muscle weakness (generalized): Secondary | ICD-10-CM | POA: Diagnosis not present

## 2014-10-29 DIAGNOSIS — M25519 Pain in unspecified shoulder: Secondary | ICD-10-CM | POA: Diagnosis not present

## 2014-10-29 DIAGNOSIS — M19012 Primary osteoarthritis, left shoulder: Secondary | ICD-10-CM | POA: Diagnosis not present

## 2014-10-29 DIAGNOSIS — M19011 Primary osteoarthritis, right shoulder: Secondary | ICD-10-CM | POA: Diagnosis not present

## 2014-10-29 DIAGNOSIS — R278 Other lack of coordination: Secondary | ICD-10-CM | POA: Diagnosis not present

## 2014-10-30 DIAGNOSIS — M25519 Pain in unspecified shoulder: Secondary | ICD-10-CM | POA: Diagnosis not present

## 2014-10-30 DIAGNOSIS — R278 Other lack of coordination: Secondary | ICD-10-CM | POA: Diagnosis not present

## 2014-10-30 DIAGNOSIS — K5900 Constipation, unspecified: Secondary | ICD-10-CM | POA: Diagnosis not present

## 2014-10-30 DIAGNOSIS — R279 Unspecified lack of coordination: Secondary | ICD-10-CM | POA: Diagnosis not present

## 2014-10-30 DIAGNOSIS — M6281 Muscle weakness (generalized): Secondary | ICD-10-CM | POA: Diagnosis not present

## 2014-10-30 DIAGNOSIS — M19012 Primary osteoarthritis, left shoulder: Secondary | ICD-10-CM | POA: Diagnosis not present

## 2014-10-30 DIAGNOSIS — M19011 Primary osteoarthritis, right shoulder: Secondary | ICD-10-CM | POA: Diagnosis not present

## 2014-10-31 DIAGNOSIS — M25519 Pain in unspecified shoulder: Secondary | ICD-10-CM | POA: Diagnosis not present

## 2014-10-31 DIAGNOSIS — R278 Other lack of coordination: Secondary | ICD-10-CM | POA: Diagnosis not present

## 2014-10-31 DIAGNOSIS — M19011 Primary osteoarthritis, right shoulder: Secondary | ICD-10-CM | POA: Diagnosis not present

## 2014-10-31 DIAGNOSIS — M19012 Primary osteoarthritis, left shoulder: Secondary | ICD-10-CM | POA: Diagnosis not present

## 2014-10-31 DIAGNOSIS — R279 Unspecified lack of coordination: Secondary | ICD-10-CM | POA: Diagnosis not present

## 2014-10-31 DIAGNOSIS — M6281 Muscle weakness (generalized): Secondary | ICD-10-CM | POA: Diagnosis not present

## 2014-11-03 DIAGNOSIS — R278 Other lack of coordination: Secondary | ICD-10-CM | POA: Diagnosis not present

## 2014-11-03 DIAGNOSIS — M25519 Pain in unspecified shoulder: Secondary | ICD-10-CM | POA: Diagnosis not present

## 2014-11-03 DIAGNOSIS — M19012 Primary osteoarthritis, left shoulder: Secondary | ICD-10-CM | POA: Diagnosis not present

## 2014-11-03 DIAGNOSIS — M19011 Primary osteoarthritis, right shoulder: Secondary | ICD-10-CM | POA: Diagnosis not present

## 2014-11-03 DIAGNOSIS — R279 Unspecified lack of coordination: Secondary | ICD-10-CM | POA: Diagnosis not present

## 2014-11-03 DIAGNOSIS — M6281 Muscle weakness (generalized): Secondary | ICD-10-CM | POA: Diagnosis not present

## 2014-11-04 DIAGNOSIS — M19012 Primary osteoarthritis, left shoulder: Secondary | ICD-10-CM | POA: Diagnosis not present

## 2014-11-04 DIAGNOSIS — R278 Other lack of coordination: Secondary | ICD-10-CM | POA: Diagnosis not present

## 2014-11-04 DIAGNOSIS — R279 Unspecified lack of coordination: Secondary | ICD-10-CM | POA: Diagnosis not present

## 2014-11-04 DIAGNOSIS — M25519 Pain in unspecified shoulder: Secondary | ICD-10-CM | POA: Diagnosis not present

## 2014-11-04 DIAGNOSIS — M19011 Primary osteoarthritis, right shoulder: Secondary | ICD-10-CM | POA: Diagnosis not present

## 2014-11-04 DIAGNOSIS — M6281 Muscle weakness (generalized): Secondary | ICD-10-CM | POA: Diagnosis not present

## 2014-11-05 DIAGNOSIS — M25519 Pain in unspecified shoulder: Secondary | ICD-10-CM | POA: Diagnosis not present

## 2014-11-05 DIAGNOSIS — M19012 Primary osteoarthritis, left shoulder: Secondary | ICD-10-CM | POA: Diagnosis not present

## 2014-11-05 DIAGNOSIS — M19011 Primary osteoarthritis, right shoulder: Secondary | ICD-10-CM | POA: Diagnosis not present

## 2014-11-05 DIAGNOSIS — R279 Unspecified lack of coordination: Secondary | ICD-10-CM | POA: Diagnosis not present

## 2014-11-05 DIAGNOSIS — M6281 Muscle weakness (generalized): Secondary | ICD-10-CM | POA: Diagnosis not present

## 2014-11-05 DIAGNOSIS — R278 Other lack of coordination: Secondary | ICD-10-CM | POA: Diagnosis not present

## 2014-11-06 DIAGNOSIS — M19012 Primary osteoarthritis, left shoulder: Secondary | ICD-10-CM | POA: Diagnosis not present

## 2014-11-06 DIAGNOSIS — M6281 Muscle weakness (generalized): Secondary | ICD-10-CM | POA: Diagnosis not present

## 2014-11-06 DIAGNOSIS — R278 Other lack of coordination: Secondary | ICD-10-CM | POA: Diagnosis not present

## 2014-11-06 DIAGNOSIS — R279 Unspecified lack of coordination: Secondary | ICD-10-CM | POA: Diagnosis not present

## 2014-11-06 DIAGNOSIS — M19011 Primary osteoarthritis, right shoulder: Secondary | ICD-10-CM | POA: Diagnosis not present

## 2014-11-06 DIAGNOSIS — M25519 Pain in unspecified shoulder: Secondary | ICD-10-CM | POA: Diagnosis not present

## 2014-11-10 DIAGNOSIS — M6281 Muscle weakness (generalized): Secondary | ICD-10-CM | POA: Diagnosis not present

## 2014-11-10 DIAGNOSIS — R279 Unspecified lack of coordination: Secondary | ICD-10-CM | POA: Diagnosis not present

## 2014-11-10 DIAGNOSIS — R278 Other lack of coordination: Secondary | ICD-10-CM | POA: Diagnosis not present

## 2014-11-10 DIAGNOSIS — M19011 Primary osteoarthritis, right shoulder: Secondary | ICD-10-CM | POA: Diagnosis not present

## 2014-11-10 DIAGNOSIS — M19012 Primary osteoarthritis, left shoulder: Secondary | ICD-10-CM | POA: Diagnosis not present

## 2014-11-10 DIAGNOSIS — M25519 Pain in unspecified shoulder: Secondary | ICD-10-CM | POA: Diagnosis not present

## 2014-11-11 DIAGNOSIS — M6281 Muscle weakness (generalized): Secondary | ICD-10-CM | POA: Diagnosis not present

## 2014-11-11 DIAGNOSIS — M25519 Pain in unspecified shoulder: Secondary | ICD-10-CM | POA: Diagnosis not present

## 2014-11-11 DIAGNOSIS — R279 Unspecified lack of coordination: Secondary | ICD-10-CM | POA: Diagnosis not present

## 2014-11-11 DIAGNOSIS — M19011 Primary osteoarthritis, right shoulder: Secondary | ICD-10-CM | POA: Diagnosis not present

## 2014-11-11 DIAGNOSIS — M19012 Primary osteoarthritis, left shoulder: Secondary | ICD-10-CM | POA: Diagnosis not present

## 2014-11-11 DIAGNOSIS — R278 Other lack of coordination: Secondary | ICD-10-CM | POA: Diagnosis not present

## 2014-11-12 DIAGNOSIS — M19011 Primary osteoarthritis, right shoulder: Secondary | ICD-10-CM | POA: Diagnosis not present

## 2014-11-12 DIAGNOSIS — M19012 Primary osteoarthritis, left shoulder: Secondary | ICD-10-CM | POA: Diagnosis not present

## 2014-11-12 DIAGNOSIS — R279 Unspecified lack of coordination: Secondary | ICD-10-CM | POA: Diagnosis not present

## 2014-11-12 DIAGNOSIS — R278 Other lack of coordination: Secondary | ICD-10-CM | POA: Diagnosis not present

## 2014-11-12 DIAGNOSIS — M25519 Pain in unspecified shoulder: Secondary | ICD-10-CM | POA: Diagnosis not present

## 2014-11-12 DIAGNOSIS — M6281 Muscle weakness (generalized): Secondary | ICD-10-CM | POA: Diagnosis not present

## 2014-11-13 DIAGNOSIS — M6281 Muscle weakness (generalized): Secondary | ICD-10-CM | POA: Diagnosis not present

## 2014-11-13 DIAGNOSIS — M25519 Pain in unspecified shoulder: Secondary | ICD-10-CM | POA: Diagnosis not present

## 2014-11-13 DIAGNOSIS — M19012 Primary osteoarthritis, left shoulder: Secondary | ICD-10-CM | POA: Diagnosis not present

## 2014-11-13 DIAGNOSIS — R279 Unspecified lack of coordination: Secondary | ICD-10-CM | POA: Diagnosis not present

## 2014-11-13 DIAGNOSIS — R278 Other lack of coordination: Secondary | ICD-10-CM | POA: Diagnosis not present

## 2014-11-13 DIAGNOSIS — M19011 Primary osteoarthritis, right shoulder: Secondary | ICD-10-CM | POA: Diagnosis not present

## 2014-11-17 DIAGNOSIS — M25519 Pain in unspecified shoulder: Secondary | ICD-10-CM | POA: Diagnosis not present

## 2014-11-17 DIAGNOSIS — R279 Unspecified lack of coordination: Secondary | ICD-10-CM | POA: Diagnosis not present

## 2014-11-17 DIAGNOSIS — M25512 Pain in left shoulder: Secondary | ICD-10-CM | POA: Diagnosis not present

## 2014-11-17 DIAGNOSIS — M19012 Primary osteoarthritis, left shoulder: Secondary | ICD-10-CM | POA: Diagnosis not present

## 2014-11-17 DIAGNOSIS — M24812 Other specific joint derangements of left shoulder, not elsewhere classified: Secondary | ICD-10-CM | POA: Diagnosis not present

## 2014-11-17 DIAGNOSIS — M24811 Other specific joint derangements of right shoulder, not elsewhere classified: Secondary | ICD-10-CM | POA: Diagnosis not present

## 2014-11-17 DIAGNOSIS — M6281 Muscle weakness (generalized): Secondary | ICD-10-CM | POA: Diagnosis not present

## 2014-11-17 DIAGNOSIS — R278 Other lack of coordination: Secondary | ICD-10-CM | POA: Diagnosis not present

## 2014-11-17 DIAGNOSIS — Z9181 History of falling: Secondary | ICD-10-CM | POA: Diagnosis not present

## 2014-11-17 DIAGNOSIS — R2681 Unsteadiness on feet: Secondary | ICD-10-CM | POA: Diagnosis not present

## 2014-11-17 DIAGNOSIS — M19011 Primary osteoarthritis, right shoulder: Secondary | ICD-10-CM | POA: Diagnosis not present

## 2014-11-17 DIAGNOSIS — M25511 Pain in right shoulder: Secondary | ICD-10-CM | POA: Diagnosis not present

## 2014-11-18 DIAGNOSIS — M19011 Primary osteoarthritis, right shoulder: Secondary | ICD-10-CM | POA: Diagnosis not present

## 2014-11-18 DIAGNOSIS — M25519 Pain in unspecified shoulder: Secondary | ICD-10-CM | POA: Diagnosis not present

## 2014-11-18 DIAGNOSIS — M19012 Primary osteoarthritis, left shoulder: Secondary | ICD-10-CM | POA: Diagnosis not present

## 2014-11-18 DIAGNOSIS — R278 Other lack of coordination: Secondary | ICD-10-CM | POA: Diagnosis not present

## 2014-11-18 DIAGNOSIS — R279 Unspecified lack of coordination: Secondary | ICD-10-CM | POA: Diagnosis not present

## 2014-11-18 DIAGNOSIS — M6281 Muscle weakness (generalized): Secondary | ICD-10-CM | POA: Diagnosis not present

## 2014-11-19 DIAGNOSIS — R279 Unspecified lack of coordination: Secondary | ICD-10-CM | POA: Diagnosis not present

## 2014-11-19 DIAGNOSIS — M25519 Pain in unspecified shoulder: Secondary | ICD-10-CM | POA: Diagnosis not present

## 2014-11-19 DIAGNOSIS — M6281 Muscle weakness (generalized): Secondary | ICD-10-CM | POA: Diagnosis not present

## 2014-11-19 DIAGNOSIS — M19012 Primary osteoarthritis, left shoulder: Secondary | ICD-10-CM | POA: Diagnosis not present

## 2014-11-19 DIAGNOSIS — R278 Other lack of coordination: Secondary | ICD-10-CM | POA: Diagnosis not present

## 2014-11-19 DIAGNOSIS — M19011 Primary osteoarthritis, right shoulder: Secondary | ICD-10-CM | POA: Diagnosis not present

## 2014-11-20 DIAGNOSIS — E039 Hypothyroidism, unspecified: Secondary | ICD-10-CM | POA: Diagnosis not present

## 2014-11-20 DIAGNOSIS — M25519 Pain in unspecified shoulder: Secondary | ICD-10-CM | POA: Diagnosis not present

## 2014-11-20 DIAGNOSIS — R279 Unspecified lack of coordination: Secondary | ICD-10-CM | POA: Diagnosis not present

## 2014-11-20 DIAGNOSIS — R278 Other lack of coordination: Secondary | ICD-10-CM | POA: Diagnosis not present

## 2014-11-20 DIAGNOSIS — M19011 Primary osteoarthritis, right shoulder: Secondary | ICD-10-CM | POA: Diagnosis not present

## 2014-11-20 DIAGNOSIS — D649 Anemia, unspecified: Secondary | ICD-10-CM | POA: Diagnosis not present

## 2014-11-20 DIAGNOSIS — E871 Hypo-osmolality and hyponatremia: Secondary | ICD-10-CM | POA: Diagnosis not present

## 2014-11-20 DIAGNOSIS — M19012 Primary osteoarthritis, left shoulder: Secondary | ICD-10-CM | POA: Diagnosis not present

## 2014-11-20 DIAGNOSIS — M6281 Muscle weakness (generalized): Secondary | ICD-10-CM | POA: Diagnosis not present

## 2014-11-20 LAB — CBC AND DIFFERENTIAL
HCT: 32 % — AB (ref 36–46)
HEMOGLOBIN: 10.4 g/dL — AB (ref 12.0–16.0)
PLATELETS: 280 10*3/uL (ref 150–399)
WBC: 9.6 10*3/mL

## 2014-11-20 LAB — HEPATIC FUNCTION PANEL
ALK PHOS: 70 U/L (ref 25–125)
ALT: 8 U/L (ref 7–35)
AST: 12 U/L — AB (ref 13–35)
Bilirubin, Total: 0.6 mg/dL

## 2014-11-20 LAB — BASIC METABOLIC PANEL
BUN: 15 mg/dL (ref 4–21)
Creatinine: 0.9 mg/dL (ref 0.5–1.1)
Glucose: 74 mg/dL
POTASSIUM: 4 mmol/L (ref 3.4–5.3)
Sodium: 137 mmol/L (ref 137–147)

## 2014-11-20 LAB — TSH: TSH: 2.94 u[IU]/mL (ref 0.41–5.90)

## 2014-11-21 DIAGNOSIS — R279 Unspecified lack of coordination: Secondary | ICD-10-CM | POA: Diagnosis not present

## 2014-11-21 DIAGNOSIS — M19012 Primary osteoarthritis, left shoulder: Secondary | ICD-10-CM | POA: Diagnosis not present

## 2014-11-21 DIAGNOSIS — M25519 Pain in unspecified shoulder: Secondary | ICD-10-CM | POA: Diagnosis not present

## 2014-11-21 DIAGNOSIS — M19011 Primary osteoarthritis, right shoulder: Secondary | ICD-10-CM | POA: Diagnosis not present

## 2014-11-21 DIAGNOSIS — R278 Other lack of coordination: Secondary | ICD-10-CM | POA: Diagnosis not present

## 2014-11-21 DIAGNOSIS — M6281 Muscle weakness (generalized): Secondary | ICD-10-CM | POA: Diagnosis not present

## 2014-11-24 DIAGNOSIS — M25519 Pain in unspecified shoulder: Secondary | ICD-10-CM | POA: Diagnosis not present

## 2014-11-24 DIAGNOSIS — R279 Unspecified lack of coordination: Secondary | ICD-10-CM | POA: Diagnosis not present

## 2014-11-24 DIAGNOSIS — M19012 Primary osteoarthritis, left shoulder: Secondary | ICD-10-CM | POA: Diagnosis not present

## 2014-11-24 DIAGNOSIS — M19011 Primary osteoarthritis, right shoulder: Secondary | ICD-10-CM | POA: Diagnosis not present

## 2014-11-24 DIAGNOSIS — R278 Other lack of coordination: Secondary | ICD-10-CM | POA: Diagnosis not present

## 2014-11-24 DIAGNOSIS — M6281 Muscle weakness (generalized): Secondary | ICD-10-CM | POA: Diagnosis not present

## 2014-11-25 ENCOUNTER — Non-Acute Institutional Stay: Payer: Medicare Other | Admitting: Internal Medicine

## 2014-11-25 ENCOUNTER — Encounter: Payer: Self-pay | Admitting: Internal Medicine

## 2014-11-25 VITALS — BP 124/80 | HR 72 | Temp 96.5°F

## 2014-11-25 DIAGNOSIS — M7501 Adhesive capsulitis of right shoulder: Secondary | ICD-10-CM

## 2014-11-25 DIAGNOSIS — F0390 Unspecified dementia without behavioral disturbance: Secondary | ICD-10-CM

## 2014-11-25 DIAGNOSIS — F32A Depression, unspecified: Secondary | ICD-10-CM

## 2014-11-25 DIAGNOSIS — F329 Major depressive disorder, single episode, unspecified: Secondary | ICD-10-CM

## 2014-11-25 DIAGNOSIS — R350 Frequency of micturition: Secondary | ICD-10-CM

## 2014-11-25 DIAGNOSIS — M19011 Primary osteoarthritis, right shoulder: Secondary | ICD-10-CM

## 2014-11-25 DIAGNOSIS — M19012 Primary osteoarthritis, left shoulder: Secondary | ICD-10-CM

## 2014-11-25 DIAGNOSIS — M7502 Adhesive capsulitis of left shoulder: Secondary | ICD-10-CM | POA: Diagnosis not present

## 2014-11-25 DIAGNOSIS — R739 Hyperglycemia, unspecified: Secondary | ICD-10-CM

## 2014-11-25 NOTE — Progress Notes (Signed)
Patient ID: [name], female   DOB: [birth year], 81 y.o.   MRN: [id number]   Location: [facility]  Code Status: DNR  Allergies  Allergen Reactions  . Iohexol     IV Dye  . Naprosyn [Naproxen]     Chief Complaint  Patient presents with  . Medical Management of Chronic Issues    dementia, depression, shoulder pain. Here with son Shon Hale and daughter-in-law Annice Pih    HPI: Patient is a 81 y.o. white female AL resident seen in the office today for med mgt of chronic diseases.  HOH and left hearing aide is out.    Has been having shoulder pain.  Dr. Chilton Si had sent her over to rehab after pain was so bad she couldn't get out of bed.  Had a pain patch which made her very delirious.  She continues to have confusion.  Did have cortisone shots for arthritis.    Urinary frequency to 15-20 times per night.  24 hr caregiver present in AL room.    Has a lot of crepitus in knees.   Review of Systems:  Review of Systems  Constitutional: Positive for weight loss and malaise/fatigue. Negative for fever.  HENT: Positive for hearing loss.   Eyes: Negative for blurred vision.       Wears glasses  Respiratory: Negative for shortness of breath.   Cardiovascular: Negative for chest pain, palpitations and leg swelling.  Gastrointestinal: Negative for abdominal pain and constipation.       Poor appetite  Genitourinary: Positive for urgency and frequency. Negative for dysuria.  Musculoskeletal: Positive for myalgias and joint pain. Negative for falls.  Neurological: Positive for weakness. Negative for dizziness.  Psychiatric/Behavioral: Positive for depression and memory loss. The patient is not nervous/anxious and does not have insomnia.        Tends to stay in her room     Past Medical History  Diagnosis Date  . GERD (gastroesophageal reflux disease)   . IBS (irritable bowel syndrome)   . Glaucoma   . Hemorrhoids   . Anxiety and depression   . Diverticulosis   . HLD  (hyperlipidemia)   . Hypothyroidism   . Arthritis   . Anemia, unspecified   . Diabetes mellitus without complication   . Palpitations     Past Surgical History  Procedure Laterality Date  . Appendectomy    . Tonsillectomy    . Colonoscopy  04/15/10    diverticulosis, sigmoid AVM, hemorrhoids    Social History:   reports that she has never smoked. She has never used smokeless tobacco. She reports that she does not drink alcohol or use illicit drugs.  Family History  Problem Relation Age of Onset  . Colon cancer Father 80  . Diabetes Mother   . Colon cancer Mother   . Heart attack Brother   . Parkinsonism Brother   . Colon cancer Paternal Uncle   . Colon cancer Paternal Grandfather   . CVA Sister     Medications: Patient's Medications  New Prescriptions   No medications on file  Previous Medications   ACETAMINOPHEN (TYLENOL) 325 MG TABLET    Take 650 mg by mouth every 6 (six) hours as needed. For pain.   BIMATOPROST (LUMIGAN) 0.01 % SOLN    Place 1 drop into both eyes at bedtime.    CHOLECALCIFEROL ([employer]S VITAMIN D) 2000 UNITS CAPS    Take 1 capsule by mouth daily. (vitamin d)   DEXTROMETHORPHAN-GUAIFENESIN 10-100 MG/5ML LIQUID    Take  5 mLs by mouth every 4 (four) hours as needed (cough).   DORZOLAMIDE-TIMOLOL (COSOPT) 22.3-6.8 MG/ML OPHTHALMIC SOLUTION    Place 1 drop into both eyes every morning.    DULOXETINE (CYMBALTA) 30 MG CAPSULE    Take 30 mg by mouth daily. Take one tablet daily   GLIMEPIRIDE PO    Take 0.5 mg by mouth daily.   IBUPROFEN (ADVIL,MOTRIN) 200 MG TABLET    Take 200 mg by mouth every 4 (four) hours as needed for moderate pain (pain).   LEVOTHYROXINE (SYNTHROID, LEVOTHROID) 25 MCG TABLET    Take 37.5 mcg by mouth daily.    LIDOCAINE (LIDODERM) 5 %    Apply 1/2 patch bilateral shoulders every morning   LUTEIN 20 MG CAPS    Take 1 capsule by mouth every morning.   ONDANSETRON (ZOFRAN) 4 MG TABLET    One tablet every 6 hours as needed for nausea   POLYETHYL  GLYCOL-PROPYL GLYCOL (SYSTANE) 0.4-0.3 % SOLN    Apply 1-2 drops to eye every hour as needed.   POLYETHYLENE GLYCOL POWDER (GLYCOLAX/MIRALAX) POWDER    Take 1 Container by mouth 2 (two) times daily as needed for mild constipation.    RESTASIS 0.05 % OPHTHALMIC EMULSION    Place 1 drop into both eyes every 12 (twelve) hours.    TEMAZEPAM (RESTORIL) 15 MG CAPSULE    Take one at bedtime   TRAMADOL (ULTRAM) 50 MG TABLET    One tablet twice daily as needed for pain  Modified Medications   No medications on file  Discontinued Medications   No medications on file     Physical Exam: Filed Vitals:   11/25/14 1533  BP: 124/80  Pulse: 72  Temp: 96.5 F (35.8 C)  TempSrc: Oral  SpO2: 99%  Physical Exam  Constitutional:  Frail white female seated in wheelchair  HENT:  Head: Normocephalic and atraumatic.  Cardiovascular: Normal rate, regular rhythm, normal heart sounds and intact distal pulses.   Pulmonary/Chest: Effort normal and breath sounds normal. No respiratory distress.  Abdominal: Soft. Bowel sounds are normal. She exhibits no distension and no mass. There is no tenderness.  Musculoskeletal: She exhibits tenderness.  Unable to abduct right shoulder more than 5-10 degrees--just lifting her hand causes pain;  Left arm she can abduct to almost 90  Neurological: She is alert.  Skin: Skin is warm and dry.  Psychiatric:  Flat affect, difficult to get her to smile     Labs reviewed: Basic Metabolic Panel:  Recent Labs  16/10/96 1928  NA 128*  K 4.4  CL 89*  CO2 27  GLUCOSE 114*  BUN 20  CREATININE 1.02  CALCIUM 9.0   Liver Function Tests:  Recent Labs  10/02/14 1928  AST 23  ALT 13  ALKPHOS 84  BILITOT 0.3  PROT 6.9  ALBUMIN 3.4*   No results for input(s): LIPASE, AMYLASE in the last 8760 hours. No results for input(s): AMMONIA in the last 8760 hours. CBC:  Recent Labs  10/02/14 1928  WBC 16.6*  NEUTROABS 12.7*  HGB 10.7*  HCT 32.5*  MCV 88.3  PLT 274     Lipid Panel: No results for input(s): CHOL, HDL, LDLCALC, TRIG, CHOLHDL, LDLDIRECT in the last 8760 hours. No results found for: HGBA1C  Assessment/Plan 1. Adhesive capsulitis of both shoulders -will d/c lidoderm patches--unclear if helping, but pain remains severe -will try tramadol  po tid scheduled -cont prn tylenol -also will prescribe voltaren gel to bilateral shoulders for pain -  has had therapy, but now has minimal movement--uncertain that more would help much and she is in too much pain to participate at present  2. Primary osteoarthritis of both shoulders -likely initial cause, also seems to have some rotator cuff tendinitis, ? Prior tears -orders written to avoid lifting her by her shoulders and to use gait belt for transfers  3. Urinary frequency -with some urgency and incontinence -urine dipstick last week was negative -will try myrbetriq 25mg  po daily for these symptoms -monitor I/Os with hat in toilet to be sure she does not develop retention from the medication (primary side effect I've seen)  4. Dementia, without behavioral disturbance -she is requiring full care at this point partially due to this, but also due to lack of ability to use her arms much at all -she is not on dementia medications--consider these if family is interested and try to taper off the restoril for sleep next time if possible in favor of increasing cymbalta for depression and pain -also plans are to try to get her a SNF bed but none available at present  5. Depression -discussed possibly increasing cymbalta to 60mg  next time -she is currently on 30mg , but remains very unhappy and antisocial, but she attributes this mostly to pain so pain was treated first  6. Hyperglycemia -last glucose wnl--will need hba1c next time to see if she needs this--at risk for hypoglycemia with it--would prefer tradjenta if hba1c>8--otherwise probably wouldn't medicate at this point  Labs/tests ordered: no new  today--recent labs reviewed with normal glucose, tsh  Next appt:  Keep regular appt with Shanda Bumps upcoming  Shanterria Franta L. Jackie Russman, D.O. Geriatrics [facility] [address], [place] [address] Cell Phone (Mon-Fri 8am-5pm):  [phone] On Call:  [phone] & follow prompts after 5pm & weekends Office Phone:  [phone] Office Fax:  [phone]

## 2014-11-26 DIAGNOSIS — M19012 Primary osteoarthritis, left shoulder: Secondary | ICD-10-CM | POA: Diagnosis not present

## 2014-11-26 DIAGNOSIS — R279 Unspecified lack of coordination: Secondary | ICD-10-CM | POA: Diagnosis not present

## 2014-11-26 DIAGNOSIS — M6281 Muscle weakness (generalized): Secondary | ICD-10-CM | POA: Diagnosis not present

## 2014-11-26 DIAGNOSIS — M19011 Primary osteoarthritis, right shoulder: Secondary | ICD-10-CM | POA: Diagnosis not present

## 2014-11-26 DIAGNOSIS — M25519 Pain in unspecified shoulder: Secondary | ICD-10-CM | POA: Diagnosis not present

## 2014-11-26 DIAGNOSIS — R278 Other lack of coordination: Secondary | ICD-10-CM | POA: Diagnosis not present

## 2014-11-27 DIAGNOSIS — M19012 Primary osteoarthritis, left shoulder: Secondary | ICD-10-CM | POA: Diagnosis not present

## 2014-11-27 DIAGNOSIS — M6281 Muscle weakness (generalized): Secondary | ICD-10-CM | POA: Diagnosis not present

## 2014-11-27 DIAGNOSIS — R279 Unspecified lack of coordination: Secondary | ICD-10-CM | POA: Diagnosis not present

## 2014-11-27 DIAGNOSIS — R278 Other lack of coordination: Secondary | ICD-10-CM | POA: Diagnosis not present

## 2014-11-27 DIAGNOSIS — M19011 Primary osteoarthritis, right shoulder: Secondary | ICD-10-CM | POA: Diagnosis not present

## 2014-11-27 DIAGNOSIS — M25519 Pain in unspecified shoulder: Secondary | ICD-10-CM | POA: Diagnosis not present

## 2014-11-28 DIAGNOSIS — M19012 Primary osteoarthritis, left shoulder: Secondary | ICD-10-CM | POA: Diagnosis not present

## 2014-11-28 DIAGNOSIS — M6281 Muscle weakness (generalized): Secondary | ICD-10-CM | POA: Diagnosis not present

## 2014-11-28 DIAGNOSIS — R278 Other lack of coordination: Secondary | ICD-10-CM | POA: Diagnosis not present

## 2014-11-28 DIAGNOSIS — M25519 Pain in unspecified shoulder: Secondary | ICD-10-CM | POA: Diagnosis not present

## 2014-11-28 DIAGNOSIS — M19011 Primary osteoarthritis, right shoulder: Secondary | ICD-10-CM | POA: Diagnosis not present

## 2014-11-28 DIAGNOSIS — R279 Unspecified lack of coordination: Secondary | ICD-10-CM | POA: Diagnosis not present

## 2014-12-01 DIAGNOSIS — M6281 Muscle weakness (generalized): Secondary | ICD-10-CM | POA: Diagnosis not present

## 2014-12-01 DIAGNOSIS — R278 Other lack of coordination: Secondary | ICD-10-CM | POA: Diagnosis not present

## 2014-12-01 DIAGNOSIS — M19011 Primary osteoarthritis, right shoulder: Secondary | ICD-10-CM | POA: Diagnosis not present

## 2014-12-01 DIAGNOSIS — M19012 Primary osteoarthritis, left shoulder: Secondary | ICD-10-CM | POA: Diagnosis not present

## 2014-12-01 DIAGNOSIS — M25519 Pain in unspecified shoulder: Secondary | ICD-10-CM | POA: Diagnosis not present

## 2014-12-01 DIAGNOSIS — R279 Unspecified lack of coordination: Secondary | ICD-10-CM | POA: Diagnosis not present

## 2014-12-02 DIAGNOSIS — M25519 Pain in unspecified shoulder: Secondary | ICD-10-CM | POA: Diagnosis not present

## 2014-12-02 DIAGNOSIS — M6281 Muscle weakness (generalized): Secondary | ICD-10-CM | POA: Diagnosis not present

## 2014-12-02 DIAGNOSIS — M19011 Primary osteoarthritis, right shoulder: Secondary | ICD-10-CM | POA: Diagnosis not present

## 2014-12-02 DIAGNOSIS — M19012 Primary osteoarthritis, left shoulder: Secondary | ICD-10-CM | POA: Diagnosis not present

## 2014-12-02 DIAGNOSIS — R279 Unspecified lack of coordination: Secondary | ICD-10-CM | POA: Diagnosis not present

## 2014-12-02 DIAGNOSIS — R278 Other lack of coordination: Secondary | ICD-10-CM | POA: Diagnosis not present

## 2014-12-03 DIAGNOSIS — R279 Unspecified lack of coordination: Secondary | ICD-10-CM | POA: Diagnosis not present

## 2014-12-03 DIAGNOSIS — M25519 Pain in unspecified shoulder: Secondary | ICD-10-CM | POA: Diagnosis not present

## 2014-12-03 DIAGNOSIS — M19011 Primary osteoarthritis, right shoulder: Secondary | ICD-10-CM | POA: Diagnosis not present

## 2014-12-03 DIAGNOSIS — R278 Other lack of coordination: Secondary | ICD-10-CM | POA: Diagnosis not present

## 2014-12-03 DIAGNOSIS — M19012 Primary osteoarthritis, left shoulder: Secondary | ICD-10-CM | POA: Diagnosis not present

## 2014-12-03 DIAGNOSIS — M6281 Muscle weakness (generalized): Secondary | ICD-10-CM | POA: Diagnosis not present

## 2014-12-04 DIAGNOSIS — M19011 Primary osteoarthritis, right shoulder: Secondary | ICD-10-CM | POA: Diagnosis not present

## 2014-12-04 DIAGNOSIS — M25519 Pain in unspecified shoulder: Secondary | ICD-10-CM | POA: Diagnosis not present

## 2014-12-04 DIAGNOSIS — R279 Unspecified lack of coordination: Secondary | ICD-10-CM | POA: Diagnosis not present

## 2014-12-04 DIAGNOSIS — M6281 Muscle weakness (generalized): Secondary | ICD-10-CM | POA: Diagnosis not present

## 2014-12-04 DIAGNOSIS — M19012 Primary osteoarthritis, left shoulder: Secondary | ICD-10-CM | POA: Diagnosis not present

## 2014-12-04 DIAGNOSIS — R278 Other lack of coordination: Secondary | ICD-10-CM | POA: Diagnosis not present

## 2014-12-08 DIAGNOSIS — M25519 Pain in unspecified shoulder: Secondary | ICD-10-CM | POA: Diagnosis not present

## 2014-12-08 DIAGNOSIS — M19011 Primary osteoarthritis, right shoulder: Secondary | ICD-10-CM | POA: Diagnosis not present

## 2014-12-08 DIAGNOSIS — M19012 Primary osteoarthritis, left shoulder: Secondary | ICD-10-CM | POA: Diagnosis not present

## 2014-12-08 DIAGNOSIS — R279 Unspecified lack of coordination: Secondary | ICD-10-CM | POA: Diagnosis not present

## 2014-12-08 DIAGNOSIS — R278 Other lack of coordination: Secondary | ICD-10-CM | POA: Diagnosis not present

## 2014-12-08 DIAGNOSIS — M6281 Muscle weakness (generalized): Secondary | ICD-10-CM | POA: Diagnosis not present

## 2014-12-12 DIAGNOSIS — M6281 Muscle weakness (generalized): Secondary | ICD-10-CM | POA: Diagnosis not present

## 2014-12-12 DIAGNOSIS — M19012 Primary osteoarthritis, left shoulder: Secondary | ICD-10-CM | POA: Diagnosis not present

## 2014-12-12 DIAGNOSIS — M19011 Primary osteoarthritis, right shoulder: Secondary | ICD-10-CM | POA: Diagnosis not present

## 2014-12-12 DIAGNOSIS — R279 Unspecified lack of coordination: Secondary | ICD-10-CM | POA: Diagnosis not present

## 2014-12-12 DIAGNOSIS — R278 Other lack of coordination: Secondary | ICD-10-CM | POA: Diagnosis not present

## 2014-12-12 DIAGNOSIS — M25519 Pain in unspecified shoulder: Secondary | ICD-10-CM | POA: Diagnosis not present

## 2014-12-16 DIAGNOSIS — M24812 Other specific joint derangements of left shoulder, not elsewhere classified: Secondary | ICD-10-CM | POA: Diagnosis not present

## 2014-12-16 DIAGNOSIS — M19012 Primary osteoarthritis, left shoulder: Secondary | ICD-10-CM | POA: Diagnosis not present

## 2014-12-16 DIAGNOSIS — M6281 Muscle weakness (generalized): Secondary | ICD-10-CM | POA: Diagnosis not present

## 2014-12-16 DIAGNOSIS — M25512 Pain in left shoulder: Secondary | ICD-10-CM | POA: Diagnosis not present

## 2014-12-16 DIAGNOSIS — R278 Other lack of coordination: Secondary | ICD-10-CM | POA: Diagnosis not present

## 2014-12-16 DIAGNOSIS — M19011 Primary osteoarthritis, right shoulder: Secondary | ICD-10-CM | POA: Diagnosis not present

## 2014-12-16 DIAGNOSIS — M24811 Other specific joint derangements of right shoulder, not elsewhere classified: Secondary | ICD-10-CM | POA: Diagnosis not present

## 2014-12-16 DIAGNOSIS — M25511 Pain in right shoulder: Secondary | ICD-10-CM | POA: Diagnosis not present

## 2014-12-17 DIAGNOSIS — R278 Other lack of coordination: Secondary | ICD-10-CM | POA: Diagnosis not present

## 2014-12-17 DIAGNOSIS — M19011 Primary osteoarthritis, right shoulder: Secondary | ICD-10-CM | POA: Diagnosis not present

## 2014-12-17 DIAGNOSIS — M19012 Primary osteoarthritis, left shoulder: Secondary | ICD-10-CM | POA: Diagnosis not present

## 2014-12-17 DIAGNOSIS — M24811 Other specific joint derangements of right shoulder, not elsewhere classified: Secondary | ICD-10-CM | POA: Diagnosis not present

## 2014-12-17 DIAGNOSIS — M24812 Other specific joint derangements of left shoulder, not elsewhere classified: Secondary | ICD-10-CM | POA: Diagnosis not present

## 2014-12-17 DIAGNOSIS — M6281 Muscle weakness (generalized): Secondary | ICD-10-CM | POA: Diagnosis not present

## 2014-12-18 DIAGNOSIS — M19011 Primary osteoarthritis, right shoulder: Secondary | ICD-10-CM | POA: Diagnosis not present

## 2014-12-18 DIAGNOSIS — R278 Other lack of coordination: Secondary | ICD-10-CM | POA: Diagnosis not present

## 2014-12-18 DIAGNOSIS — M19012 Primary osteoarthritis, left shoulder: Secondary | ICD-10-CM | POA: Diagnosis not present

## 2014-12-18 DIAGNOSIS — M24811 Other specific joint derangements of right shoulder, not elsewhere classified: Secondary | ICD-10-CM | POA: Diagnosis not present

## 2014-12-18 DIAGNOSIS — M24812 Other specific joint derangements of left shoulder, not elsewhere classified: Secondary | ICD-10-CM | POA: Diagnosis not present

## 2014-12-18 DIAGNOSIS — M6281 Muscle weakness (generalized): Secondary | ICD-10-CM | POA: Diagnosis not present

## 2014-12-31 ENCOUNTER — Encounter: Payer: Medicare Other | Admitting: Nurse Practitioner

## 2015-01-07 ENCOUNTER — Non-Acute Institutional Stay: Payer: Medicare Other | Admitting: Nurse Practitioner

## 2015-01-07 ENCOUNTER — Encounter: Payer: Self-pay | Admitting: Nurse Practitioner

## 2015-01-07 VITALS — BP 130/64 | HR 74 | Temp 97.8°F | Ht 60.0 in | Wt 112.0 lb

## 2015-01-07 DIAGNOSIS — M19011 Primary osteoarthritis, right shoulder: Secondary | ICD-10-CM | POA: Diagnosis not present

## 2015-01-07 DIAGNOSIS — H409 Unspecified glaucoma: Secondary | ICD-10-CM

## 2015-01-07 DIAGNOSIS — F329 Major depressive disorder, single episode, unspecified: Secondary | ICD-10-CM

## 2015-01-07 DIAGNOSIS — R350 Frequency of micturition: Secondary | ICD-10-CM | POA: Diagnosis not present

## 2015-01-07 DIAGNOSIS — F0390 Unspecified dementia without behavioral disturbance: Secondary | ICD-10-CM | POA: Diagnosis not present

## 2015-01-07 DIAGNOSIS — E039 Hypothyroidism, unspecified: Secondary | ICD-10-CM | POA: Diagnosis not present

## 2015-01-07 DIAGNOSIS — R739 Hyperglycemia, unspecified: Secondary | ICD-10-CM

## 2015-01-07 DIAGNOSIS — M19012 Primary osteoarthritis, left shoulder: Secondary | ICD-10-CM

## 2015-01-07 DIAGNOSIS — F32A Depression, unspecified: Secondary | ICD-10-CM

## 2015-01-07 NOTE — Progress Notes (Signed)
Failed clock drawing  

## 2015-01-07 NOTE — Progress Notes (Signed)
Patient ID: [name], female   DOB: [birth year], 81 y.o.   MRN: [id number]    Nursing Home Location:  [facility]   Place of Service: Clinic (12)  PCP: [name], TIFFANY, DO  Allergies  Allergen Reactions  . Iohexol     IV Dye  . Naprosyn [Naproxen]     Chief Complaint  Patient presents with  . Annual Exam    Comprehensive exam: Diabetes, thyroid, depression, dementia, here with son Shon Hale    HPI:  Patient is a 81 y.o. female seen today at [facility] for extended visit. Hx provided by son.  Son reports pt has been pretty stable and found a good balance.  Pain is fairly well controlled- pts mentation is much clearer since they have found the balance with pain medication. Still not going to activites. 24 hour care in her assisted living. Awaiting for a skilled bed.  Has had signification weight loss, Decreased appetite. Not taking her supplements.  No longer request screening.  Would not want cancer treated if found.   Review of Systems:  Review of Systems  Constitutional: Positive for fatigue and unexpected weight change (10 lb weight loss since decemeber). Negative for activity change and appetite change.  HENT: Negative for congestion and hearing loss.   Eyes: Negative.   Respiratory: Negative for cough and shortness of breath.   Cardiovascular: Negative for chest pain, palpitations and leg swelling.  Gastrointestinal: Negative for abdominal pain, diarrhea and constipation.  Genitourinary: Negative for dysuria and difficulty urinating.       Some episodes of incontinence - has improved on myrbetriq  Musculoskeletal: Positive for arthralgias (shoulder pain). Negative for myalgias.  Skin: Negative for color change and wound.  Neurological: Negative for dizziness and weakness.  Psychiatric/Behavioral: Positive for confusion. Negative for behavioral problems and agitation.    Past Medical History  Diagnosis Date  . GERD  (gastroesophageal reflux disease)   . IBS (irritable bowel syndrome)   . Glaucoma   . Hemorrhoids   . Anxiety and depression   . Diverticulosis   . HLD (hyperlipidemia)   . Hypothyroidism   . Arthritis   . Anemia, unspecified   . Diabetes mellitus without complication   . Palpitations    Past Surgical History  Procedure Laterality Date  . Appendectomy    . Tonsillectomy    . Colonoscopy  04/15/10    diverticulosis, sigmoid AVM, hemorrhoids   Social History:   reports that she has never smoked. She has never used smokeless tobacco. She reports that she does not drink alcohol or use illicit drugs.  Family History  Problem Relation Age of Onset  . Colon cancer Father 32  . Diabetes Mother   . Colon cancer Mother   . Heart attack Brother   . Parkinsonism Brother   . Colon cancer Paternal Uncle   . Colon cancer Paternal Grandfather   . CVA Sister     Medications: Patient's Medications  New Prescriptions   No medications on file  Previous Medications   ACETAMINOPHEN (TYLENOL) 325 MG TABLET    Take 650 mg by mouth every 6 (six) hours as needed. For pain.   BIMATOPROST (LUMIGAN) 0.01 % SOLN    Place 1 drop into both eyes at bedtime.    CHOLECALCIFEROL ([employer]S VITAMIN D) 2000 UNITS CAPS    Take 1 capsule by mouth daily. (vitamin d)   DEXTROMETHORPHAN-GUAIFENESIN 10-100 MG/5ML LIQUID    Take 5 mLs by mouth every 4 (four) hours as  needed (cough).   DICLOFENAC SODIUM (VOLTAREN) 1 % GEL    Apply topically. Apply bilateral to shoulders as needed for pain   DORZOLAMIDE-TIMOLOL (COSOPT) 22.3-6.8 MG/ML OPHTHALMIC SOLUTION    Place 1 drop into both eyes every morning.    DULOXETINE (CYMBALTA) 30 MG CAPSULE    Take 30 mg by mouth daily. Take one tablet daily   GLIMEPIRIDE PO    Take 0.5 mg by mouth daily.   IBUPROFEN (ADVIL,MOTRIN) 200 MG TABLET    Take 200 mg by mouth every 4 (four) hours as needed for moderate pain (pain).   LEVOTHYROXINE (SYNTHROID, LEVOTHROID) 25 MCG TABLET    Take 37.5  mcg by mouth daily.    LUTEIN 20 MG CAPS    Take 1 capsule by mouth every morning.   MYRBETRIQ 25 MG TB24 TABLET    Take one daily for bladder   ONDANSETRON (ZOFRAN) 4 MG TABLET    One tablet every 6 hours as needed for nausea   POLYETHYL GLYCOL-PROPYL GLYCOL (SYSTANE) 0.4-0.3 % SOLN    Apply 1-2 drops to eye every hour as needed.   POLYETHYLENE GLYCOL POWDER (GLYCOLAX/MIRALAX) POWDER    Take 1 Container by mouth 2 (two) times daily as needed for mild constipation.    RESTASIS 0.05 % OPHTHALMIC EMULSION    Place 1 drop into both eyes every 12 (twelve) hours.    TEMAZEPAM (RESTORIL) 15 MG CAPSULE    Take one at bedtime   TRAMADOL (ULTRAM) 50 MG TABLET    One tablet twice daily as needed for pain  Modified Medications   No medications on file  Discontinued Medications   LIDOCAINE (LIDODERM) 5 %    Apply 1/2 patch bilateral shoulders every morning     Physical Exam: Filed Vitals:   01/07/15 1443  BP: 130/64  Pulse: 74  Temp: 97.8 F (36.6 C)  TempSrc: Oral  Height: 5' (1.524 m)  Weight: 112 lb (50.803 kg)  SpO2: 99%    Physical Exam  Constitutional: No distress.  Frail thin female  HENT:  Head: Normocephalic and atraumatic.  Mouth/Throat: Oropharynx is clear and moist. No oropharyngeal exudate.  Eyes: Conjunctivae are normal. Pupils are equal, round, and reactive to light.  Neck: Normal range of motion. Neck supple.  Cardiovascular: Normal rate, regular rhythm and normal heart sounds.   Pulmonary/Chest: Effort normal and breath sounds normal.  Abdominal: Soft. Bowel sounds are normal.  Musculoskeletal: She exhibits tenderness (with decrease ROM to bilateral shoulders). She exhibits no edema.  Neurological: She is alert.  Skin: Skin is warm and dry. She is not diaphoretic.  Psychiatric: She has a normal mood and affect.    Labs reviewed: Basic Metabolic Panel:  Recent Labs  16/10/96 1928 11/20/14  NA 128* 137  K 4.4 4.0  CL 89*  --   CO2 27  --   GLUCOSE 114*  --     BUN 20 15  CREATININE 1.02 0.9  CALCIUM 9.0  --    Liver Function Tests:  Recent Labs  10/02/14 1928 11/20/14  AST 23 12*  ALT 13 8  ALKPHOS 84 70  BILITOT 0.3  --   PROT 6.9  --   ALBUMIN 3.4*  --    No results for input(s): LIPASE, AMYLASE in the last 8760 hours. No results for input(s): AMMONIA in the last 8760 hours. CBC:  Recent Labs  10/02/14 1928 11/20/14  WBC 16.6* 9.6  NEUTROABS 12.7*  --   HGB 10.7* 10.4*  HCT 32.5* 32*  MCV 88.3  --   PLT 274 280   TSH:  Recent Labs  11/20/14  TSH 2.94   A1C: No results found for: HGBA1C Lipid Panel: No results for input(s): CHOL, HDL, LDLCALC, TRIG, CHOLHDL, LDLDIRECT in the last 8760 hours.    Assessment/Plan  1. Hypothyroidism, unspecified hypothyroidism type TSH reviewed and WNL, will cont current medication   2. Glaucoma -conts drops, encouraged routine eye exam (per son it has been a while)  3. Depression - will increase to Cymbalta 60 mg at this time to help with mood and pain   4. Primary osteoarthritis of both shoulders -cause of most of pts pain. Finely a good regimen has been established with pain and pain medications   5. Dementia, without behavioral disturbance MMSE worse at 17/30, not currently on medications, family not interested in starting this at this time.   -goal is for pt to move to skilled bed, due to worsening dementia and needing 24 hours care. currently on waiting lis  6. Urinary frequency -has greatly improved on myrbetriq  7. Hyperglycemia CBGs reviewed, in appropriate range for age. conts on glimepiride. Will follow up A1c prior to next visit  To follow up in 3 months or sooner if needed

## 2015-03-03 DIAGNOSIS — H4011X4 Primary open-angle glaucoma, indeterminate stage: Secondary | ICD-10-CM | POA: Diagnosis not present

## 2015-03-03 DIAGNOSIS — H04123 Dry eye syndrome of bilateral lacrimal glands: Secondary | ICD-10-CM | POA: Diagnosis not present

## 2015-03-26 DIAGNOSIS — E871 Hypo-osmolality and hyponatremia: Secondary | ICD-10-CM | POA: Diagnosis not present

## 2015-03-26 DIAGNOSIS — E088 Diabetes mellitus due to underlying condition with unspecified complications: Secondary | ICD-10-CM | POA: Diagnosis not present

## 2015-03-26 DIAGNOSIS — D649 Anemia, unspecified: Secondary | ICD-10-CM | POA: Diagnosis not present

## 2015-03-26 LAB — BASIC METABOLIC PANEL
BUN: 23 mg/dL — AB (ref 4–21)
Creatinine: 1 mg/dL (ref 0.5–1.1)
GLUCOSE: 71 mg/dL
Potassium: 4.5 mmol/L (ref 3.4–5.3)
Sodium: 139 mmol/L (ref 137–147)

## 2015-03-26 LAB — HEPATIC FUNCTION PANEL
ALT: 8 U/L (ref 7–35)
AST: 11 U/L — AB (ref 13–35)
Alkaline Phosphatase: 72 U/L (ref 25–125)
BILIRUBIN, TOTAL: 0.5 mg/dL

## 2015-03-26 LAB — CBC AND DIFFERENTIAL
HEMATOCRIT: 33 % — AB (ref 36–46)
HEMOGLOBIN: 10.6 g/dL — AB (ref 12.0–16.0)
PLATELETS: 243 10*3/uL (ref 150–399)
WBC: 8.9 10^3/mL

## 2015-03-26 LAB — HEMOGLOBIN A1C: HEMOGLOBIN A1C: 6.1 % — AB (ref 4.0–6.0)

## 2015-04-01 ENCOUNTER — Non-Acute Institutional Stay: Payer: Medicare Other | Admitting: Nurse Practitioner

## 2015-04-01 ENCOUNTER — Encounter: Payer: Self-pay | Admitting: Nurse Practitioner

## 2015-04-01 VITALS — BP 132/64 | HR 68 | Temp 97.6°F | Wt 113.0 lb

## 2015-04-01 DIAGNOSIS — F32A Depression, unspecified: Secondary | ICD-10-CM

## 2015-04-01 DIAGNOSIS — M19011 Primary osteoarthritis, right shoulder: Secondary | ICD-10-CM

## 2015-04-01 DIAGNOSIS — E039 Hypothyroidism, unspecified: Secondary | ICD-10-CM | POA: Diagnosis not present

## 2015-04-01 DIAGNOSIS — D649 Anemia, unspecified: Secondary | ICD-10-CM | POA: Diagnosis not present

## 2015-04-01 DIAGNOSIS — R739 Hyperglycemia, unspecified: Secondary | ICD-10-CM

## 2015-04-01 DIAGNOSIS — M19012 Primary osteoarthritis, left shoulder: Secondary | ICD-10-CM

## 2015-04-01 DIAGNOSIS — R350 Frequency of micturition: Secondary | ICD-10-CM

## 2015-04-01 DIAGNOSIS — F329 Major depressive disorder, single episode, unspecified: Secondary | ICD-10-CM | POA: Diagnosis not present

## 2015-04-01 NOTE — Progress Notes (Signed)
Patient ID: [name], female   DOB: 04-21-1918, 81 y.o.   MRN: [id number]    Nursing Home Location:  [facility]   Place of Service: Clinic (12)  PCP: [name], TIFFANY, DO  Allergies  Allergen Reactions  . Iohexol     IV Dye  . Naprosyn [Naproxen]     Chief Complaint  Patient presents with  . Medical Management of Chronic Issues    blood sugar, depression, thyroid, dementia. Here with son Shon Hale  . Shoulder Pain    bilateral, worse    HPI:  Patient is a 81 y.o. female seen today at [facility] for routine follow up, here with son.  Biggest complaint is shoulder pain. Son inquires about shoulder injections, has had these done in the past the shots did not help but questions if they try it again if it might work.   Review of Systems:  Review of Systems  Constitutional: Positive for fatigue. Negative for activity change, appetite change and unexpected weight change.  HENT: Negative for congestion and hearing loss.   Eyes: Negative.   Respiratory: Negative for cough and shortness of breath.   Cardiovascular: Negative for chest pain, palpitations and leg swelling.  Gastrointestinal: Negative for abdominal pain, diarrhea and constipation.  Genitourinary: Positive for frequency (reports ongoing frequency). Negative for dysuria and difficulty urinating.       Some episodes of incontinence - has improved on myrbetriq  Musculoskeletal: Positive for arthralgias (shoulder pain). Negative for myalgias.  Skin: Negative for color change and wound.  Neurological: Negative for dizziness and weakness.  Psychiatric/Behavioral: Positive for confusion. Negative for behavioral problems and agitation.    Past Medical History  Diagnosis Date  . GERD (gastroesophageal reflux disease)   . IBS (irritable bowel syndrome)   . Glaucoma   . Hemorrhoids   . Anxiety and depression   . Diverticulosis   . HLD (hyperlipidemia)   . Hypothyroidism   .  Arthritis   . Anemia, unspecified   . Diabetes mellitus without complication   . Palpitations    Past Surgical History  Procedure Laterality Date  . Appendectomy    . Tonsillectomy    . Colonoscopy  04/15/10    diverticulosis, sigmoid AVM, hemorrhoids   Social History:   reports that she has never smoked. She has never used smokeless tobacco. She reports that she does not drink alcohol or use illicit drugs.  Family History  Problem Relation Age of Onset  . Colon cancer Father 11  . Diabetes Mother   . Colon cancer Mother   . Heart attack Brother   . Parkinsonism Brother   . Colon cancer Paternal Uncle   . Colon cancer Paternal Grandfather   . CVA Sister     Medications: Patient's Medications  New Prescriptions   No medications on file  Previous Medications   ACETAMINOPHEN (TYLENOL) 325 MG TABLET    Take 650 mg by mouth every 6 (six) hours as needed. For pain.   BIMATOPROST (LUMIGAN) 0.01 % SOLN    Place 1 drop into both eyes at bedtime.    CHOLECALCIFEROL ([employer]S VITAMIN D) 2000 UNITS CAPS    Take 1 capsule by mouth daily. (vitamin d)   DEXTROMETHORPHAN-GUAIFENESIN 10-100 MG/5ML LIQUID    Take 5 mLs by mouth every 4 (four) hours as needed (cough).   DICLOFENAC SODIUM (VOLTAREN) 1 % GEL    Apply topically. Apply bilateral to shoulders as needed for pain   DORZOLAMIDE-TIMOLOL (COSOPT) 22.3-6.8 MG/ML OPHTHALMIC SOLUTION  Place 1 drop into both eyes every morning.    DULOXETINE (CYMBALTA) 60 MG CAPSULE    Take one tablet daily   GLIMEPIRIDE PO    Take 0.5 mg by mouth daily.   IBUPROFEN (ADVIL,MOTRIN) 200 MG TABLET    Take 200 mg by mouth every 8 (eight) hours as needed for moderate pain (pain).    LEVOTHYROXINE (SYNTHROID, LEVOTHROID) 25 MCG TABLET    Take 37.5 mcg by mouth daily.    LUTEIN 20 MG CAPS    Take 1 capsule by mouth every morning.   MYRBETRIQ 25 MG TB24 TABLET    Take one daily for bladder   ONDANSETRON (ZOFRAN) 4 MG TABLET    One tablet every 6 hours as needed for  nausea   POLYETHYL GLYCOL-PROPYL GLYCOL (SYSTANE) 0.4-0.3 % SOLN    Apply 1-2 drops to eye every hour as needed.   POLYETHYLENE GLYCOL POWDER (GLYCOLAX/MIRALAX) POWDER    Take 1 Container by mouth 2 (two) times daily as needed for mild constipation.    RESTASIS 0.05 % OPHTHALMIC EMULSION    Place 1 drop into both eyes every 12 (twelve) hours.    TEMAZEPAM (RESTORIL) 15 MG CAPSULE    Take one at bedtime   TRAMADOL (ULTRAM) 50 MG TABLET    One tablet twice daily as needed for pain  Modified Medications   No medications on file  Discontinued Medications   DULOXETINE (CYMBALTA) 30 MG CAPSULE    Take 30 mg by mouth daily. Take one tablet daily     Physical Exam: Filed Vitals:   04/01/15 1401  BP: 132/64  Pulse: 68  Temp: 97.6 F (36.4 C)  TempSrc: Oral  Weight: 113 lb (51.256 kg)    Physical Exam  Constitutional: No distress.  Frail thin female  HENT:  Head: Normocephalic and atraumatic.  Mouth/Throat: Oropharynx is clear and moist. No oropharyngeal exudate.  Eyes: Conjunctivae are normal. Pupils are equal, round, and reactive to light.  Neck: Normal range of motion. Neck supple.  Cardiovascular: Normal rate, regular rhythm and normal heart sounds.   Pulmonary/Chest: Effort normal and breath sounds normal.  Abdominal: Soft. Bowel sounds are normal.  Musculoskeletal: She exhibits tenderness (with decrease ROM to bilateral shoulders). She exhibits no edema.  Neurological: She is alert.  Skin: Skin is warm and dry. She is not diaphoretic.  Psychiatric: She has a normal mood and affect.    Labs reviewed: Basic Metabolic Panel:  Recent Labs  16/10/96 1928 11/20/14 03/26/15  NA 128* 137 139  K 4.4 4.0 4.5  CL 89*  --   --   CO2 27  --   --   GLUCOSE 114*  --   --   BUN 20 15 23*  CREATININE 1.02 0.9 1.0  CALCIUM 9.0  --   --    Liver Function Tests:  Recent Labs  10/02/14 1928 11/20/14 03/26/15  AST 23 12* 11*  ALT ALKPHOS 84 70 72  BILITOT 0.3  --   --     PROT 6.9  --   --   ALBUMIN 3.4*  --   --    No results for input(s): LIPASE, AMYLASE in the last 8760 hours. No results for input(s): AMMONIA in the last 8760 hours. CBC:  Recent Labs  10/02/14 1928 11/20/14 03/26/15  WBC 16.6* 9.6 8.9  NEUTROABS 12.7*  --   --   HGB 10.7* 10.4* 10.6*  HCT 32.5* 32* 33*  MCV 88.3  --   --  PLT 274 280 243   TSH:  Recent Labs  11/20/14  TSH 2.94   A1C: Lab Results  Component Value Date   HGBA1C 6.1* 03/26/2015   Lipid Panel: No results for input(s): CHOL, HDL, LDLCALC, TRIG, CHOLHDL, LDLDIRECT in the last 8760 hours.    Assessment/Plan  1. Hypothyroidism, unspecified hypothyroidism type TSH stable in January, will cont current dose  2. Depression -improved at this time, has caregivers which gets her out and active -conts on cymbalta   3. Primary osteoarthritis of both shoulders worsening shoulder pain, was refusing Voltaren gel 1% so it was changed to PRN, will reorder BID to be done during when she changes clothes.  -also son would like her to see ortho for shoulder injections  4. Urinary frequency Improved but still gets up frequently at night, will increase mybetriq to 50 mg by mouth daily   5. Hyperglycemia A1c at 6.1, conts on glimepride   6. Anemia Recent CBC reviewed and Hgb has been stable

## 2015-05-20 DIAGNOSIS — H4011X1 Primary open-angle glaucoma, mild stage: Secondary | ICD-10-CM | POA: Diagnosis not present

## 2015-08-05 ENCOUNTER — Encounter: Payer: Self-pay | Admitting: Internal Medicine

## 2015-08-05 ENCOUNTER — Non-Acute Institutional Stay: Payer: Medicare Other | Admitting: Internal Medicine

## 2015-08-05 VITALS — BP 114/64 | HR 68 | Temp 97.7°F | Wt 110.0 lb

## 2015-08-05 DIAGNOSIS — E039 Hypothyroidism, unspecified: Secondary | ICD-10-CM | POA: Diagnosis not present

## 2015-08-05 DIAGNOSIS — M1712 Unilateral primary osteoarthritis, left knee: Secondary | ICD-10-CM

## 2015-08-05 DIAGNOSIS — N3281 Overactive bladder: Secondary | ICD-10-CM | POA: Diagnosis not present

## 2015-08-05 DIAGNOSIS — F0390 Unspecified dementia without behavioral disturbance: Secondary | ICD-10-CM | POA: Diagnosis not present

## 2015-08-05 DIAGNOSIS — M19012 Primary osteoarthritis, left shoulder: Secondary | ICD-10-CM

## 2015-08-05 DIAGNOSIS — M19011 Primary osteoarthritis, right shoulder: Secondary | ICD-10-CM

## 2015-08-05 DIAGNOSIS — Z23 Encounter for immunization: Secondary | ICD-10-CM

## 2015-08-05 DIAGNOSIS — R739 Hyperglycemia, unspecified: Secondary | ICD-10-CM

## 2015-08-05 DIAGNOSIS — F329 Major depressive disorder, single episode, unspecified: Secondary | ICD-10-CM

## 2015-08-05 DIAGNOSIS — F32A Depression, unspecified: Secondary | ICD-10-CM

## 2015-08-05 NOTE — Addendum Note (Signed)
Addended by: Charna Elizabeth on: 08/05/2015 03:30 PM   Modules accepted: Orders

## 2015-08-05 NOTE — Progress Notes (Signed)
Patient ID: [name], female   DOB: 1918/05/14, 81 y.o.   MRN: [id number]   Location:  [facility] Code Status: DNR  Goals of Care:Advanced Directive information Does patient have an advance directive?: Yes, Type of Advance Directive: Healthcare Power of [place];Living will;Out of facility DNR (pink MOST or yellow form), Pre-existing out of facility DNR order (yellow form or pink MOST form): Yellow form placed in chart (order not valid for inpatient use), Does patient want to make changes to advanced directive?: No - Patient declined  Chief Complaint  Patient presents with  . Medical Management of Chronic Issues    thyroid, depression, blood sugar. Here with son Shon Hale    HPI: Patient is a 81 y.o. white female seen in the [facility] today for med mgt chronic diseases.    Doing fair--she c/o more and more difficulty walking.  Has a bad left knee and it hurts.  Her son, Shon Hale, notes the knee is less painful also than it had been.  Not willing to do PT on it.  Two shoulders also hurt but not complaining about those as much--topicals helping on that.  Had been to [facility] previously for shots in them with little benefit.  Same things as last time otherwise, Shon Hale notes.  Memory fairly clear.  Confused more in the mornings and gets better during the day.    3-4x at night instead of 10-12 x urinating at night now that on myrbetriq.  Tolerating this well.    Goes right back to sleep after she gets up, also.  Still on temazepam at hs.  Attempts to discontinue this have been unsuccessful.  Has had increased purple discoloration of feet/legs.  They are red in th mornings but improve through the day.  Still quite purple this afternoon.  No edema.  No pain in them.  No tingling or numbness.  They are cold but pulses palpable.  DMII:  No low glucose noted.  Takes glimepiride.  Needs hba1c.  Review of Systems:  Review of Systems  Constitutional: Negative for fever and  chills.  HENT: Positive for hearing loss. Negative for congestion.        Cerumen impaction on left recently--was flushed this am with large volumes of wax removed  Eyes: Negative for blurred vision.       Glasses  Respiratory: Negative for shortness of breath.   Cardiovascular: Negative for chest pain.  Gastrointestinal: Negative for abdominal pain, blood in stool and melena.  Genitourinary: Positive for urgency and frequency. Negative for dysuria.       Decreased frequency at hs  Musculoskeletal: Positive for joint pain. Negative for falls.  Skin: Negative for itching and rash.  Neurological: Positive for weakness. Negative for dizziness.  Psychiatric/Behavioral: Positive for memory loss. Negative for depression. The patient is not nervous/anxious and does not have insomnia.     Past Medical History  Diagnosis Date  . GERD (gastroesophageal reflux disease)   . IBS (irritable bowel syndrome)   . Glaucoma   . Hemorrhoids   . Anxiety and depression   . Diverticulosis   . HLD (hyperlipidemia)   . Hypothyroidism   . Arthritis   . Anemia, unspecified   . Diabetes mellitus without complication   . Palpitations     Past Surgical History  Procedure Laterality Date  . Appendectomy    . Tonsillectomy    . Colonoscopy  04/15/10    diverticulosis, sigmoid AVM, hemorrhoids    Social History:  reports that she has never smoked. She has never used smokeless tobacco. She reports that she does not drink alcohol or use illicit drugs.  Allergies  Allergen Reactions  . Iohexol     IV Dye  . Naprosyn [Naproxen]     Medications: Patient's Medications  New Prescriptions   No medications on file  Previous Medications   ACETAMINOPHEN (TYLENOL) 325 MG TABLET    Take 650 mg by mouth every 6 (six) hours as needed. For pain.   BIMATOPROST (LUMIGAN) 0.01 % SOLN    Place 1 drop into both eyes at bedtime.    CHOLECALCIFEROL ([employer]S VITAMIN D) 2000 UNITS CAPS    Take 1 capsule by mouth daily.  (vitamin d)   DEXTROMETHORPHAN-GUAIFENESIN 10-100 MG/5ML LIQUID    Take 5 mLs by mouth every 4 (four) hours as needed (cough).   DICLOFENAC SODIUM (VOLTAREN) 1 % GEL    Apply topically. Apply bilateral to shoulders and knees as needed for pain   DORZOLAMIDE-TIMOLOL (COSOPT) 22.3-6.8 MG/ML OPHTHALMIC SOLUTION    Place 1 drop into both eyes every morning.    DULOXETINE (CYMBALTA) 60 MG CAPSULE    Take one tablet daily   GLIMEPIRIDE PO    Take 0.5 mg by mouth daily. Before breakfast   IBUPROFEN (ADVIL,MOTRIN) 200 MG TABLET    Take 200 mg by mouth every 8 (eight) hours as needed for moderate pain (pain).    LEVOTHYROXINE (SYNTHROID, LEVOTHROID) 25 MCG TABLET    Take 37.5 mcg by mouth daily.    LUTEIN 20 MG CAPS    Take 1 capsule by mouth every morning.   MYRBETRIQ 25 MG TB24 TABLET    Take one daily for bladder   ONDANSETRON (ZOFRAN) 4 MG TABLET    One tablet every 6 hours as needed for nausea   POLYETHYL GLYCOL-PROPYL GLYCOL (SYSTANE) 0.4-0.3 % SOLN    Apply 1-2 drops to eye every hour as needed.   POLYETHYLENE GLYCOL POWDER (GLYCOLAX/MIRALAX) POWDER    Take 1 Container by mouth. Take 1/2 container by mouth two times daily as needed for mild constipation   RESTASIS 0.05 % OPHTHALMIC EMULSION    Place 1 drop into both eyes every 12 (twelve) hours.    TEMAZEPAM (RESTORIL) 15 MG CAPSULE    Take one at bedtime   TRAMADOL (ULTRAM) 50 MG TABLET    50 mg. One-half  tablet three times daily as needed for pain  Modified Medications   No medications on file  Discontinued Medications   No medications on file     Physical Exam: Filed Vitals:   08/05/15 1355  BP: 114/64  Pulse: 68  Temp: 97.7 F (36.5 C)  TempSrc: Oral  Weight: 110 lb (49.896 kg)  SpO2: 94%   Body mass index is 21.48 kg/(m^2). Physical Exam  Constitutional: She appears well-developed and well-nourished. No distress.  HENT:  Has bilateral hearing aides  Eyes:  glasses  Cardiovascular: Normal rate, regular rhythm, normal heart  sounds and intact distal pulses.   Pulmonary/Chest: Effort normal and breath sounds normal. No respiratory distress.  Abdominal: Soft. Bowel sounds are normal.  Musculoskeletal: Normal range of motion. She exhibits tenderness.  Left knee, worse when walking; came down in wheelchair today  Neurological: She is alert.  Providing appropriate responses, alert today  Skin: Skin is warm and dry.  Purple feet cold to touch, pulses intact, varicose veins present     Labs reviewed: Basic Metabolic Panel:  Recent Labs  16/10/96 1928 11/20/14 03/26/15  NA 128* 137 139  K 4.4 4.0 4.5  CL 89*  --   --   CO2 27  --   --   GLUCOSE 114*  --   --   BUN 20 15 23*  CREATININE 1.02 0.9 1.0  CALCIUM 9.0  --   --   TSH  --  2.94  --    Liver Function Tests:  Recent Labs  10/02/14 1928 11/20/14 03/26/15  AST 23 12* 11*  ALT ALKPHOS 84 70 72  BILITOT 0.3  --   --   PROT 6.9  --   --   ALBUMIN 3.4*  --   --    No results for input(s): LIPASE, AMYLASE in the last 8760 hours. No results for input(s): AMMONIA in the last 8760 hours. CBC:  Recent Labs  10/02/14 1928 11/20/14 03/26/15  WBC 16.6* 9.6 8.9  NEUTROABS 12.7*  --   --   HGB 10.7* 10.4* 10.6*  HCT 32.5* 32* 33*  MCV 88.3  --   --   PLT 274 280 243   Lipid Panel: No results for input(s): CHOL, HDL, LDLCALC, TRIG, CHOLHDL, LDLDIRECT in the last 8760 hours. Lab Results  Component Value Date   HGBA1C 6.1* 03/26/2015   Patient Care Team: Kermit Balo, DO as PCP - General (Geriatric Medicine) Iva Boop, MD as Consulting Physician (Gastroenterology)  Assessment/Plan 1. Hypothyroidism, unspecified hypothyroidism type -cont current synthroid therapy, f/u tsh at future visit  2. Depression -well controlled, requires temazepam for sleep at hs, also continues on cymbalta with benefit  3. Primary osteoarthritis of both shoulders -better lately, continues on voltaren gel, tylenol, prn advil -f/u bmp  4. Primary  osteoarthritis of left knee -will provide steroid injection left knee on Tues in her room in AL for this--she feels it's interfering with her walking  5. Overactive bladder -cont myrbetriq which has dramatically reduced her hs frequency  6. Hyperglycemia -cont glimepiride and reassess hba1c  7. Dementia, without behavioral disturbance -stable recently, her son requests no changes to her meds b/c she has periods of alertness in the afternoons   Labs/tests ordered:  Hba1c, bmp before Next appt:  4 mos  Hatsumi Steinhart L. Alyxandra Tenbrink, D.O. Geriatrics [facility] [address], [place] [address] Cell Phone (Mon-Fri 8am-5pm):  [phone] On Call:  [phone] & follow prompts after 5pm & weekends Office Phone:  [phone] Office Fax:  [phone]

## 2015-08-12 ENCOUNTER — Encounter: Payer: Self-pay | Admitting: Internal Medicine

## 2015-08-18 ENCOUNTER — Encounter: Payer: Self-pay | Admitting: Internal Medicine

## 2015-08-18 ENCOUNTER — Non-Acute Institutional Stay: Payer: Medicare Other | Admitting: Internal Medicine

## 2015-08-18 DIAGNOSIS — M1712 Unilateral primary osteoarthritis, left knee: Secondary | ICD-10-CM

## 2015-08-18 NOTE — Progress Notes (Deleted)
Patient ID: [name], female   DOB: 03-13-1918, 81 y.o.   MRN: [id number]

## 2015-08-23 NOTE — Progress Notes (Signed)
Patient ID: [name], female   DOB: [birth year], 81 y.o.   MRN: [id number]  Location:  [facility] Provider:  [name] L. Renato Gails, D.O., C.M.D.  Code Status:  DNR Goals of care: Advanced Directive information Type of Advance Directive: Healthcare Power of [place];Living will, Pre-existing out of facility DNR order (yellow form or pink MOST form): Yellow form placed in chart (order not valid for inpatient use);Pink MOST form placed in chart (order not valid for inpatient use)  Chief Complaint  Patient presents with  . Acute Visit    pain left knee. Knee injection    HPI:  81 yo white female long term [place] resident was seen for an acute visit for left knee injection due to advanced left knee OA unrelieved currently with more conservative therapy such as heat, ice, topical agents and oral otc meds.  She has pain >6/10 and some difficulty with the knee giving out, but is not a good surgical candidate with her mild cognitive impairment and age of 81 years.  Review of Systems:  Review of Systems  Constitutional: Negative for fever, chills and malaise/fatigue.  Musculoskeletal: Positive for joint pain. Negative for myalgias and falls.       Gait more unsteady with knee pain  Neurological: Negative for dizziness and weakness.  Psychiatric/Behavioral: Positive for memory loss. The patient is nervous/anxious and has insomnia.     Past Medical History  Diagnosis Date  . GERD (gastroesophageal reflux disease)   . IBS (irritable bowel syndrome)   . Glaucoma   . Hemorrhoids   . Anxiety and depression   . Diverticulosis   . HLD (hyperlipidemia)   . Hypothyroidism   . Arthritis   . Anemia, unspecified   . Diabetes mellitus without complication (HCC)   . Palpitations     Patient Active Problem List   Diagnosis Date Noted  . Osteoarthritis, shoulder 10/27/2014  . Dementia 10/22/2014  . Hypothyroidism 03/29/2010  . COLONIC POLYPS, ADENOMATOUS, HX OF 03/24/2009  . Irritable bowel  syndrome 07/24/2008  . HYPERLIPIDEMIA 02/26/2008  . ANXIETY 02/26/2008  . Depression 02/26/2008  . Glaucoma 02/26/2008  . GERD 02/26/2008    Allergies  Allergen Reactions  . Iohexol     IV Dye  . Losartan   . Naprosyn [Naproxen]     Medications: Patient's Medications  New Prescriptions   No medications on file  Previous Medications   ACETAMINOPHEN (TYLENOL) 325 MG TABLET    Take 650 mg by mouth every 6 (six) hours as needed. For pain.   BIMATOPROST (LUMIGAN) 0.01 % SOLN    Place 1 drop into both eyes at bedtime.    CHOLECALCIFEROL ([employer]S VITAMIN D) 2000 UNITS CAPS    Take 1 capsule by mouth daily. (vitamin d)   DEXTROMETHORPHAN-GUAIFENESIN 10-100 MG/5ML LIQUID    Take 5 mLs by mouth every 4 (four) hours as needed (cough).   DICLOFENAC SODIUM (VOLTAREN) 1 % GEL    Apply topically. Apply bilateral to shoulders and knees as needed for pain   DORZOLAMIDE-TIMOLOL (COSOPT) 22.3-6.8 MG/ML OPHTHALMIC SOLUTION    Place 1 drop into both eyes every morning.    DULOXETINE (CYMBALTA) 60 MG CAPSULE    Take one tablet daily   GLIMEPIRIDE PO    Take 0.5 mg by mouth daily. Before breakfast   IBUPROFEN (ADVIL,MOTRIN) 200 MG TABLET    Take 200 mg by mouth every 8 (eight) hours as needed for moderate pain (pain).    LEVOTHYROXINE (SYNTHROID, LEVOTHROID) 25 MCG TABLET  Take 37.5 mcg by mouth daily.    LUTEIN 20 MG CAPS    Take 1 capsule by mouth every morning.   MYRBETRIQ 25 MG TB24 TABLET    Take one daily for bladder   ONDANSETRON (ZOFRAN) 4 MG TABLET    One tablet every 6 hours as needed for nausea   POLYETHYL GLYCOL-PROPYL GLYCOL (SYSTANE) 0.4-0.3 % SOLN    Apply 1-2 drops to eye every hour as needed.   POLYETHYLENE GLYCOL POWDER (GLYCOLAX/MIRALAX) POWDER    Take 1 Container by mouth. Take 1/2 container by mouth two times daily as needed for mild constipation   RESTASIS 0.05 % OPHTHALMIC EMULSION    Place 1 drop into both eyes every 12 (twelve) hours.    TEMAZEPAM (RESTORIL) 15 MG CAPSULE    Take  one at bedtime   TRAMADOL (ULTRAM) 50 MG TABLET    50 mg. One-half  tablet three times daily as needed for pain  Modified Medications   No medications on file  Discontinued Medications   No medications on file    Physical Exam: Filed Vitals:   08/18/15 1505  BP: 124/76  Pulse: 65  Temp: 97.9 F (36.6 C)  Resp: 20  Weight: 108 lb (48.988 kg)   Body mass index is 21.09 kg/(m^2).  Physical Exam  Constitutional: She appears well-developed and well-nourished. No distress.  Thin white female  Cardiovascular: Normal rate, regular rhythm, normal heart sounds and intact distal pulses.   Pulmonary/Chest: Effort normal and breath sounds normal. No respiratory distress.  Musculoskeletal: She exhibits tenderness.  Left knee with crepitus, small effusion laterally, difficulty ambulating with her walker due to pain  Neurological: She is alert.    Labs reviewed: Basic Metabolic Panel:  Recent Labs  16/10/96 1928 11/20/14 03/26/15  NA 128* 137 139  K 4.4 4.0 4.5  CL 89*  --   --   CO2 27  --   --   GLUCOSE 114*  --   --   BUN 20 15 23*  CREATININE 1.02 0.9 1.0  CALCIUM 9.0  --   --     Liver Function Tests:  Recent Labs  10/02/14 1928 11/20/14 03/26/15  AST 23 12* 11*  ALT ALKPHOS 84 70 72  BILITOT 0.3  --   --   PROT 6.9  --   --   ALBUMIN 3.4*  --   --     CBC:  Recent Labs  10/02/14 1928 11/20/14 03/26/15  WBC 16.6* 9.6 8.9  NEUTROABS 12.7*  --   --   HGB 10.7* 10.4* 10.6*  HCT 32.5* 32* 33*  MCV 88.3  --   --   PLT 274 280 243    Lab Results  Component Value Date   TSH 2.94 11/20/2014   Lab Results  Component Value Date   HGBA1C 6.1* 03/26/2015   No results found for: CHOL, HDL, LDLCALC, LDLDIRECT, TRIG, CHOLHDL   Patient Care Team: Kermit Balo, DO as PCP - General (Geriatric Medicine) Iva Boop, MD as Consulting Physician (Gastroenterology)  Assessment/Plan 1. Primary osteoarthritis of left knee -consent obtained from pt and  son at her clinic visit and pt asked again today about desire for procedure and agrees -left knee injection site was identified and marked -medial aspect of left knee cleansed with betadine x 2, sprayed with bupivicaine freezing spray until numb, then 1cc depomedrol and 2cc 1% lidocaine were injected medially into the knee joint -pt was  asked to flex/extend knee several times to move medication, and cautioned about bearing weight for the next couple of hrs while lidocaine remains active -she tolerated the procedure well and a bandaid was placed over the injection site  Family/ staff Communication: assisted by AL RN Labs/tests ordered:  none  [name] L. Gualberto Wahlen, D.O. Geriatrics [facility] [address], [place] [address] Cell Phone (Mon-Fri 8am-5pm):  [phone] On Call:  [phone] & follow prompts after 5pm & weekends Office Phone:  [phone] Office Fax:  [phone]

## 2015-10-01 DIAGNOSIS — R4182 Altered mental status, unspecified: Secondary | ICD-10-CM | POA: Diagnosis not present

## 2015-11-26 DIAGNOSIS — R4182 Altered mental status, unspecified: Secondary | ICD-10-CM | POA: Diagnosis not present

## 2015-11-26 DIAGNOSIS — E0869 Diabetes mellitus due to underlying condition with other specified complication: Secondary | ICD-10-CM | POA: Diagnosis not present

## 2015-11-26 DIAGNOSIS — E0843 Diabetes mellitus due to underlying condition with diabetic autonomic (poly)neuropathy: Secondary | ICD-10-CM | POA: Diagnosis not present

## 2015-11-26 LAB — BASIC METABOLIC PANEL
BUN: 26 mg/dL — AB (ref 4–21)
CREATININE: 1 mg/dL (ref 0.5–1.1)
Glucose: 65 mg/dL
Potassium: 4.4 mmol/L (ref 3.4–5.3)
SODIUM: 138 mmol/L (ref 137–147)

## 2015-11-26 LAB — HEMOGLOBIN A1C: Hemoglobin A1C: 6.2

## 2015-12-02 ENCOUNTER — Encounter: Payer: Self-pay | Admitting: Internal Medicine

## 2015-12-02 ENCOUNTER — Non-Acute Institutional Stay: Payer: Medicare Other | Admitting: Internal Medicine

## 2015-12-02 VITALS — BP 104/58 | HR 72 | Temp 98.1°F | Ht 60.0 in | Wt 109.0 lb

## 2015-12-02 DIAGNOSIS — K5909 Other constipation: Secondary | ICD-10-CM

## 2015-12-02 DIAGNOSIS — E119 Type 2 diabetes mellitus without complications: Secondary | ICD-10-CM | POA: Diagnosis not present

## 2015-12-02 DIAGNOSIS — F32A Depression, unspecified: Secondary | ICD-10-CM

## 2015-12-02 DIAGNOSIS — M1712 Unilateral primary osteoarthritis, left knee: Secondary | ICD-10-CM | POA: Diagnosis not present

## 2015-12-02 DIAGNOSIS — M19011 Primary osteoarthritis, right shoulder: Secondary | ICD-10-CM | POA: Diagnosis not present

## 2015-12-02 DIAGNOSIS — L918 Other hypertrophic disorders of the skin: Secondary | ICD-10-CM | POA: Diagnosis not present

## 2015-12-02 DIAGNOSIS — E039 Hypothyroidism, unspecified: Secondary | ICD-10-CM | POA: Diagnosis not present

## 2015-12-02 DIAGNOSIS — F329 Major depressive disorder, single episode, unspecified: Secondary | ICD-10-CM | POA: Diagnosis not present

## 2015-12-02 DIAGNOSIS — M19012 Primary osteoarthritis, left shoulder: Secondary | ICD-10-CM | POA: Diagnosis not present

## 2015-12-02 DIAGNOSIS — F0390 Unspecified dementia without behavioral disturbance: Secondary | ICD-10-CM | POA: Diagnosis not present

## 2015-12-02 DIAGNOSIS — K5900 Constipation, unspecified: Secondary | ICD-10-CM

## 2015-12-02 NOTE — Progress Notes (Signed)
Patient ID: [name], female   DOB: 1918/07/23, 82 y.o.   MRN: [id number]   Location: [facility] Provider: Mirayah Wren L. Renato Gails, D.O., C.M.D.  Code Status: DNR Goals of Care: Advanced Directive information Does patient have an advance directive?: Yes, Type of Advance Directive: Healthcare Power of [place];Living will;Out of facility DNR (pink MOST or yellow form), Pre-existing out of facility DNR order (yellow form or pink MOST form): Yellow form placed in chart (order not valid for inpatient use);Pink MOST form placed in chart (order not valid for inpatient use)  Chief Complaint  Patient presents with  . Medical Management of Chronic Issues    thyroid, depression, depression. Here with son [name]    HPI: Patient is a 82 y.o. female seen in the office today for med mgt of chronic diseases.  Left knee hurting her terribly.  Feels like she can't walk b/c it hurts to walk.  Not getting better.  Dr. Thurston Hole had diagnosed the arthritis.  She had a shot in her knee.  Says it didn't help last time for even a day.  Is already getting tramadol 25mg  po tid.  Is not asking for tylenol in addition when she has more pain.  Voltaren also in use.  Has prn ibuprofen but that is risky to use regularly.  Pt historically refuses the additional meds thinking they won't help, but needs encouragement to accept them.  Has a skin tag on left chin.  Shoulders no longer painful.  voltaren helping with this.    Constipation:  Too many loose stools lately in daytime so miralax is being given only in evening (order revised).    Weight stable.  Review of Systems:  Review of Systems  Constitutional: Negative for fever and chills.  HENT: Positive for hearing loss.        Hearing aides  Eyes:       Glasses  Respiratory: Negative for shortness of breath.   Cardiovascular: Negative for chest pain.  Gastrointestinal: Positive for diarrhea and constipation. Negative for abdominal pain, blood in stool and  melena.  Genitourinary: Positive for urgency and frequency. Negative for dysuria.  Musculoskeletal: Positive for joint pain. Negative for myalgias and falls.  Skin: Negative for itching and rash.       Left side of chin skin tag annoying her  Neurological: Negative for dizziness.  Endo/Heme/Allergies: Bruises/bleeds easily.  Psychiatric/Behavioral: Positive for memory loss.    Past Medical History  Diagnosis Date  . GERD (gastroesophageal reflux disease)   . IBS (irritable bowel syndrome)   . Glaucoma   . Hemorrhoids   . Anxiety and depression   . Diverticulosis   . HLD (hyperlipidemia)   . Hypothyroidism   . Arthritis   . Anemia, unspecified   . Diabetes mellitus without complication (HCC)   . Palpitations     Past Surgical History  Procedure Laterality Date  . Appendectomy    . Tonsillectomy    . Colonoscopy  04/15/10    diverticulosis, sigmoid AVM, hemorrhoids    Allergies  Allergen Reactions  . Iohexol     IV Dye  . Losartan   . Naprosyn [Naproxen]       Medication List       This list is accurate as of: 12/02/15  2:29 PM.  Always use your most recent med list.               acetaminophen 325 MG tablet  Commonly known as:  TYLENOL  Take 650 mg by  mouth every 6 (six) hours as needed. For pain.     bimatoprost 0.01 % Soln  Commonly known as:  LUMIGAN  Place 1 drop into both eyes at bedtime.     [employer] VITAMIN D 2000 units Caps  Generic drug:  Cholecalciferol  Take 1 capsule by mouth daily. (vitamin d)     Dextromethorphan-Guaifenesin 10-100 MG/5ML liquid  Take 5 mLs by mouth every 4 (four) hours as needed (cough).     diclofenac sodium 1 % Gel  Commonly known as:  VOLTAREN  Apply topically. Apply bilateral to shoulders and knees as needed for pain     dorzolamide-timolol 22.3-6.8 MG/ML ophthalmic solution  Commonly known as:  COSOPT  Place 1 drop into both eyes every morning.     DULoxetine 60 MG capsule  Commonly known as:  CYMBALTA  Take one  tablet daily     GLIMEPIRIDE PO  Take 0.5 mg by mouth daily. Before breakfast     ibuprofen 200 MG tablet  Commonly known as:  ADVIL,MOTRIN  Take 200 mg by mouth every 8 (eight) hours as needed for moderate pain (pain).     levothyroxine 25 MCG tablet  Commonly known as:  SYNTHROID, LEVOTHROID  Take 37.5 mcg by mouth daily.     Lutein 20 MG Caps  Take 1 capsule by mouth every morning.     magnesium hydroxide 400 MG/5ML suspension  Commonly known as:  MILK OF MAGNESIA  Take by mouth. Give daily as needed for constipation     MYRBETRIQ 25 MG Tb24 tablet  Generic drug:  mirabegron ER  Take one daily for bladder     ondansetron 4 MG tablet  Commonly known as:  ZOFRAN  One tablet every 6 hours as needed for nausea     polyethylene glycol powder powder  Commonly known as:  GLYCOLAX/MIRALAX  Take 1 Container by mouth. Take 1/2 container by mouth two times daily as needed for mild constipation     RESTASIS 0.05 % ophthalmic emulsion  Generic drug:  cycloSPORINE  Place 1 drop into both eyes every 12 (twelve) hours.     SYSTANE 0.4-0.3 % Soln  Generic drug:  Polyethyl Glycol-Propyl Glycol  Apply 1-2 drops to eye every hour as needed.     temazepam 15 MG capsule  Commonly known as:  RESTORIL  Take one at bedtime     traMADol 50 MG tablet  Commonly known as:  ULTRAM  50 mg. One-half  tablet three times daily as needed for pain        Health Maintenance  Topic Date Due  . TETANUS/TDAP  10/17/1939  . ZOSTAVAX  10/16/1980  . DEXA SCAN  10/16/1985  . INFLUENZA VACCINE  06/14/2016  . PNA vac Low Risk Adult (2 of 2 - PPSV23) 08/04/2016    Physical Exam: Filed Vitals:   12/02/15 1414  BP: 104/58  Pulse: 72  Temp: 98.1 F (36.7 C)  TempSrc: Oral  Height: 5' (1.524 m)  Weight: 109 lb (49.442 kg)  SpO2: 99%   Body mass index is 21.29 kg/(m^2). Physical Exam  Constitutional: She appears well-developed and well-nourished. No distress.  Cardiovascular: Normal rate,  regular rhythm and intact distal pulses.   Pulmonary/Chest: Effort normal and breath sounds normal.  Musculoskeletal: She exhibits tenderness.  Of left lateral knee with light palpation; crepitus present  Neurological: She is alert.  Skin: Skin is warm and dry.  Long skin tag on left side of chin that she is picking  Psychiatric:  Flat affect, sarcastic, picking on her son, [name] who is with her    Labs reviewed: Basic Metabolic Panel:  Recent Labs  16/10/96 11/26/15  NA 139 138  K 4.5 4.4  BUN 23* 26*  CREATININE 1.0 1.0   Liver Function Tests:  Recent Labs  03/26/15  AST 11*  ALT 8  ALKPHOS 72   No results for input(s): LIPASE, AMYLASE in the last 8760 hours. No results for input(s): AMMONIA in the last 8760 hours. CBC:  Recent Labs  03/26/15  WBC 8.9  HGB 10.6*  HCT 33*  PLT 243   Lipid Panel: No results for input(s): CHOL, HDL, LDLCALC, TRIG, CHOLHDL, LDLDIRECT in the last 8760 hours. Lab Results  Component Value Date   HGBA1C 6.2 11/26/2015   Assessment/Plan 1. Primary osteoarthritis of left knee -sore to touch laterally -has known OA in the knee when xrayed at orthopedics, not a good candidate for surgery with her dementia -not ambulatory she reports due to her pain -cont tramadol, voltaren scheduled, use the prn tylenol when she has pain, avoid ibuprofen use due to potential side effects on her stomach and kidneys--advised her caregiver about the availability of the tylenol for use and to encourage pt to accept it so she feels better  2. Hypothyroidism, unspecified hypothyroidism type -cont current synthroid -TSH before next visit  3. Primary osteoarthritis of both shoulders -much better using the voltaren and with the tramadol scheduled  4. Depression -spirits seemed better last appt--fighting with her son during visit today and not wanting him to ask questions -cont cymbalta  -uses temazepam for sleep and has not previously tolerated  reduction  5. Controlled type 2 diabetes mellitus without complication, without long-term current use of insulin (HCC) -well controlled with glimepiride -will need urine microalbumin before appt  6. Dementia, without behavioral disturbance -maintaining in AL as far as I'm aware -cont caregiver help and monitor  7. ST (skin tag) -referred to derm for cryopen to remove (as ours is in the main office) and pt did not want it cut off  8. Chronic constipation -reduced miralax to 1/2 dose at bedtime only b/c she is only getting that anyway per caregiver -if still having too loose of stools or fecal incontinence with passing gas, would reduce to 1/2 dose every other day  Labs/tests ordered:  Tsh, urine microalbumin before Next appt:  4 mos med mgt  [name] L. Eyan Hagood, D.O. Geriatrics [facility] [address], [place] [address] Cell Phone (Mon-Fri 8am-5pm):  [phone] On Call:  [phone] & follow prompts after 5pm & weekends Office Phone:  [phone] Office Fax:  [phone]

## 2015-12-24 DIAGNOSIS — L820 Inflamed seborrheic keratosis: Secondary | ICD-10-CM | POA: Diagnosis not present

## 2015-12-24 DIAGNOSIS — L814 Other melanin hyperpigmentation: Secondary | ICD-10-CM | POA: Diagnosis not present

## 2015-12-24 DIAGNOSIS — L578 Other skin changes due to chronic exposure to nonionizing radiation: Secondary | ICD-10-CM | POA: Diagnosis not present

## 2016-03-15 DIAGNOSIS — H401131 Primary open-angle glaucoma, bilateral, mild stage: Secondary | ICD-10-CM | POA: Diagnosis not present

## 2016-03-31 DIAGNOSIS — E119 Type 2 diabetes mellitus without complications: Secondary | ICD-10-CM | POA: Diagnosis not present

## 2016-03-31 DIAGNOSIS — E039 Hypothyroidism, unspecified: Secondary | ICD-10-CM | POA: Diagnosis not present

## 2016-03-31 LAB — TSH: TSH: 3.1 u[IU]/mL (ref <=5.90)

## 2016-04-06 ENCOUNTER — Non-Acute Institutional Stay: Payer: Medicare Other | Admitting: Internal Medicine

## 2016-04-06 ENCOUNTER — Encounter: Payer: Self-pay | Admitting: Internal Medicine

## 2016-04-06 VITALS — BP 118/60 | HR 70 | Temp 97.7°F | Ht 60.0 in | Wt 110.0 lb

## 2016-04-06 DIAGNOSIS — F5105 Insomnia due to other mental disorder: Secondary | ICD-10-CM

## 2016-04-06 DIAGNOSIS — N3281 Overactive bladder: Secondary | ICD-10-CM

## 2016-04-06 DIAGNOSIS — M1712 Unilateral primary osteoarthritis, left knee: Secondary | ICD-10-CM | POA: Diagnosis not present

## 2016-04-06 DIAGNOSIS — K581 Irritable bowel syndrome with constipation: Secondary | ICD-10-CM | POA: Diagnosis not present

## 2016-04-06 DIAGNOSIS — M19011 Primary osteoarthritis, right shoulder: Secondary | ICD-10-CM

## 2016-04-06 DIAGNOSIS — F419 Anxiety disorder, unspecified: Secondary | ICD-10-CM

## 2016-04-06 DIAGNOSIS — M19012 Primary osteoarthritis, left shoulder: Secondary | ICD-10-CM

## 2016-04-06 DIAGNOSIS — H409 Unspecified glaucoma: Secondary | ICD-10-CM | POA: Diagnosis not present

## 2016-04-06 DIAGNOSIS — E039 Hypothyroidism, unspecified: Secondary | ICD-10-CM

## 2016-04-06 DIAGNOSIS — F0390 Unspecified dementia without behavioral disturbance: Secondary | ICD-10-CM

## 2016-04-06 NOTE — Progress Notes (Signed)
Location:  [facility] Service:  Clinic (12)  Provider: Karington Zarazua L. Renato Gails, D.O., C.M.D.  Code Status: DNR Goals of Care:  Advanced Directives 04/06/2016  Does patient have an advance directive? Yes  Type of Advance Directive Out of facility DNR (pink MOST or yellow form);Healthcare Power of Attorney  Copy of advanced directive(s) in chart? Yes  Pre-existing out of facility DNR order (yellow form or pink MOST form) Yellow form placed in chart (order not valid for inpatient use);Pink MOST form placed in chart (order not valid for inpatient use)   Chief Complaint  Patient presents with  . Medical Management of Chronic Issues    4 mth follow-up    HPI: Patient is a 82 y.o. female seen today for medical management of chronic diseases.    Knee still bothers her.  Goes up into her hip.  Not a surgical candidate and doesn't want surgery at 89.  Uses advil, tylenol, tramadol for pain.  Shoulder not as painful anymore.    Reports concern about not remember like she used to.  Says she does not want to really take any more meds anyway.    Says she does get help from the temazepam.  Might wake up and go urinate and fall back to sleep.  Says the voltaren gel doesn't help.  Takes tramadol 25mg  tid.  Can't really tell she takes anything.  Walks with walker and needs it all of the time.  No falls.  Hasn't been for a long time.  Has a terrible fear of falling.  Reports increased pain in the damp weather.    Bowels are doing fine.  Had bm this afternoon.    Eyes are still terrible.  Not painful, but cannot see.  Tried to read paper and could not.  Says that wasn't such a bad thing.    Urinary frequency persists at hs.  Is on myrbetriq.  Might leak a little at times when she is trying to get there with help.  Hypothyroidism:  TSH at goal.    Past Medical History  Diagnosis Date  . GERD (gastroesophageal reflux disease)   . IBS (irritable bowel syndrome)   .  Glaucoma   . Hemorrhoids   . Anxiety and depression   . Diverticulosis   . HLD (hyperlipidemia)   . Hypothyroidism   . Arthritis   . Anemia, unspecified   . Diabetes mellitus without complication (HCC)   . Palpitations     Past Surgical History  Procedure Laterality Date  . Appendectomy    . Tonsillectomy    . Colonoscopy  04/15/10    diverticulosis, sigmoid AVM, hemorrhoids    Allergies  Allergen Reactions  . Iohexol     IV Dye  . Losartan   . Naprosyn [Naproxen]       Medication List       This list is accurate as of: 04/06/16  2:16 PM.  Always use your most recent med list.               acetaminophen 325 MG tablet  Commonly known as:  TYLENOL  Take 650 mg by mouth every 6 (six) hours as needed. For pain.     bimatoprost 0.01 % Soln  Commonly known as:  LUMIGAN  Place 1 drop into both eyes at bedtime.     CVS VITAMIN D 2000 units Caps  Generic drug:  Cholecalciferol  Take 1 capsule by mouth daily. (vitamin d)  Dextromethorphan-Guaifenesin 10-100 MG/5ML liquid  Take 5 mLs by mouth every 4 (four) hours as needed (cough).     diclofenac sodium 1 % Gel  Commonly known as:  VOLTAREN  Apply topically. Apply bilateral to shoulders and knees as needed for pain     dorzolamide-timolol 22.3-6.8 MG/ML ophthalmic solution  Commonly known as:  COSOPT  Place 1 drop into both eyes every morning.     DULoxetine 60 MG capsule  Commonly known as:  CYMBALTA  Take 60 mg by mouth daily.     glimepiride 1 MG tablet  Commonly known as:  AMARYL  Take 1 mg by mouth daily with breakfast.     ibuprofen 200 MG tablet  Commonly known as:  ADVIL,MOTRIN  Take 200 mg by mouth every 8 (eight) hours as needed for moderate pain (pain).     levothyroxine 25 MCG tablet  Commonly known as:  SYNTHROID, LEVOTHROID  Take 37.5 mcg by mouth daily.     Lutein 20 MG Caps  Take 1 capsule by mouth every morning.     magnesium hydroxide 400 MG/5ML suspension  Commonly known as:   MILK OF MAGNESIA  Take by mouth. Give daily as needed for constipation     MYRBETRIQ 25 MG Tb24 tablet  Generic drug:  mirabegron ER  Take 25 mg by mouth daily.     nystatin cream  Commonly known as:  MYCOSTATIN  Apply 1 application topically 2 (two) times daily.     ondansetron 4 MG tablet  Commonly known as:  ZOFRAN  Take 4 mg by mouth every 6 (six) hours as needed for nausea.     polyethylene glycol powder powder  Commonly known as:  GLYCOLAX/MIRALAX  Take 0.5 Containers by mouth 2 (two) times daily as needed for mild constipation.     RESTASIS 0.05 % ophthalmic emulsion  Generic drug:  cycloSPORINE  Place 1 drop into both eyes every 12 (twelve) hours.     SYSTANE 0.4-0.3 % Soln  Generic drug:  Polyethyl Glycol-Propyl Glycol  Apply 1-2 drops to eye every hour as needed.     temazepam 15 MG capsule  Commonly known as:  RESTORIL  Take 15 mg by mouth at bedtime.     traMADol 50 MG tablet  Commonly known as:  ULTRAM  Take 25 mg by mouth 3 (three) times daily.        Review of Systems:  Review of Systems  Constitutional: Positive for malaise/fatigue. Negative for fever and chills.  HENT: Negative for congestion.   Eyes: Positive for blurred vision.       Dark circles under eyes  Respiratory: Negative for cough, sputum production, shortness of breath and wheezing.   Cardiovascular: Negative for chest pain, palpitations and leg swelling.  Gastrointestinal: Negative for abdominal pain, diarrhea, constipation, blood in stool and melena.  Genitourinary: Positive for urgency and frequency. Negative for dysuria and hematuria.  Musculoskeletal: Positive for joint pain. Negative for falls.  Skin: Negative for rash.  Neurological: Positive for weakness. Negative for dizziness and loss of consciousness.  Psychiatric/Behavioral: Positive for memory loss. The patient is nervous/anxious and has insomnia.        Sleeps well with temazepam, sleeps long hours    Health Maintenance   Topic Date Due  . FOOT EXAM  10/16/1930  . OPHTHALMOLOGY EXAM  10/16/1930  . URINE MICROALBUMIN  10/16/1930  . TETANUS/TDAP  10/17/1939  . ZOSTAVAX  10/16/1980  . DEXA SCAN  10/16/1985  . HEMOGLOBIN A1C  05/25/2016  . INFLUENZA VACCINE  06/14/2016  . PNA vac Low Risk Adult (2 of 2 - PPSV23) 08/04/2016    Physical Exam: Filed Vitals:   04/06/16 1357  BP: 118/60  Pulse: 70  Temp: 97.7 F (36.5 C)  TempSrc: Oral  Height: 5' (1.524 m)  Weight: 110 lb (49.896 kg)  SpO2: 95%   Body mass index is 21.48 kg/(m^2). Physical Exam  Constitutional: No distress.  Thin white female seated in wheelchair  Cardiovascular: Normal rate, regular rhythm, normal heart sounds and intact distal pulses.   Pulmonary/Chest: Effort normal and breath sounds normal. No respiratory distress.  Abdominal: Soft. Bowel sounds are normal. She exhibits no distension and no mass. There is no tenderness.  Musculoskeletal: Normal range of motion. She exhibits tenderness.  Bilateral knees and shoulders with decreased ROM shoulders  Neurological: She is alert.  Short term memory loss; is oriented to person, place, general time, but not specific date  Skin: Skin is warm and dry.  Psychiatric: She has a normal mood and affect.  Sarcastic sense of humor    Labs reviewed: Basic Metabolic Panel:  Recent Labs  16/10/96 03/31/16  NA 138  --   K 4.4  --   BUN 26*  --   CREATININE 1.0  --   TSH  --  3.10   Liver Function Tests: No results for input(s): AST, ALT, ALKPHOS, BILITOT, PROT, ALBUMIN in the last 8760 hours. No results for input(s): LIPASE, AMYLASE in the last 8760 hours. No results for input(s): AMMONIA in the last 8760 hours. CBC: No results for input(s): WBC, NEUTROABS, HGB, HCT, MCV, PLT in the last 8760 hours. Lipid Panel: No results for input(s): CHOL, HDL, LDLCALC, TRIG, CHOLHDL, LDLDIRECT in the last 8760 hours. Lab Results  Component Value Date   HGBA1C 6.2 11/26/2015    Assessment/Plan 1. Primary osteoarthritis of left knee -seems both are bad, but left worse -not really getting benefit from injections anymore -cont tylenol and occasional ibuprofen or tramadol when bad  2. Primary osteoarthritis of both shoulders -as in #1, says they're better lately vs. knees  3. Dementia, without behavioral disturbance -cont AL level of care with personal care help which has been increased -not on meds for this and unlikely to benefit considering she already requires adl help and ambulatory abilities are diminishing more by the day with her severe arthritis  4. Insomnia secondary to anxiety -cont temazepam at her request--reports she goes right back to sleep each time she gets up to urinate bc of it  5. Irritable bowel syndrome with constipation -reports no difficulty with constipation or diarrhea as of late  6. Glaucoma -is getting worse all the time, cont drops per ophtho and as much help with visual aides as possible  7. OAB (overactive bladder) -continue low dose myrbetriq--consider increase to improve benefit at next visit--seems to still urinate a lot at night  8. Hypothyroidism, unspecified hypothyroidism type -cont current synthroid, n TSH was wnl 5/18  Labs/tests ordered:   Orders Placed This Encounter  Procedures  . TSH    This external order was created through the Results Console.   Next appt:  08/10/2016 med mgt   Jermie Hippe L. Marizol Borror, D.O. Geriatrics [facility] [address] [place], [place] [address] Cell Phone (Mon-Fri 8am-5pm):  [phone] On Call:  [phone] & follow prompts after 5pm & weekends Office Phone:  [phone] Office Fax:  [phone]

## 2016-04-11 DIAGNOSIS — F5105 Insomnia due to other mental disorder: Secondary | ICD-10-CM | POA: Insufficient documentation

## 2016-04-11 DIAGNOSIS — F419 Anxiety disorder, unspecified: Secondary | ICD-10-CM | POA: Insufficient documentation

## 2016-04-11 DIAGNOSIS — N3281 Overactive bladder: Secondary | ICD-10-CM | POA: Insufficient documentation

## 2016-06-06 ENCOUNTER — Non-Acute Institutional Stay: Payer: Medicare Other | Admitting: Adult Health

## 2016-06-06 ENCOUNTER — Encounter: Payer: Self-pay | Admitting: Adult Health

## 2016-06-06 DIAGNOSIS — R05 Cough: Secondary | ICD-10-CM | POA: Diagnosis not present

## 2016-06-06 DIAGNOSIS — R42 Dizziness and giddiness: Secondary | ICD-10-CM | POA: Diagnosis not present

## 2016-06-06 DIAGNOSIS — R110 Nausea: Secondary | ICD-10-CM

## 2016-06-06 LAB — BASIC METABOLIC PANEL
BUN: 24 mg/dL — AB (ref 4–21)
Creatinine: 1 mg/dL (ref 0.5–1.1)
Glucose: 154 mg/dL
Potassium: 4.4 mmol/L (ref 3.4–5.3)
Sodium: 136 mmol/L — AB (ref 137–147)

## 2016-06-06 LAB — CBC AND DIFFERENTIAL
HCT: 37 % (ref 36–46)
Hemoglobin: 11.9 g/dL — AB (ref 12.0–16.0)
Platelets: 222 10*3/uL (ref 150–399)
WBC: 8.5 10^3/mL

## 2016-06-06 NOTE — Progress Notes (Signed)
Location:   [facility], operational of Service:  ALF (13) Provider:   Peggye Barton, ANP [facility] [phone]   Barton, Rebekah Shiley, DO  Patient Care Team: Rebekah Balo, DO as PCP - General (Geriatric Medicine) Rebekah Boop, MD as Consulting Physician (Gastroenterology)  Extended Emergency Contact Information Primary Emergency Contact: Barton,Rebekah Address: [address]          [place], [place] [address] [place] Home Phone: [phone] Mobile Phone: [phone] Relation: Son  Code Status:  DNR Goals of care: Advanced Directive information Advanced Directives 04/06/2016  Does patient have an advance directive? Yes  Type of Advance Directive Out of facility DNR (pink MOST or yellow form);Healthcare Power of Attorney  Does patient want to make changes to advanced directive? -  Copy of advanced directive(s) in chart? Yes  Pre-existing out of facility DNR order (yellow form or pink MOST form) Yellow form placed in chart (order not valid for inpatient use);Pink MOST form placed in chart (order not valid for inpatient use)     Chief Complaint  Patient presents with  . Acute Visit    dizzy, weak, nausea    HPI:  Pt is a 82 y.o. female seen today for an acute visit for dizziness, weakness, and nausea noted for two days. Resident has dementia and has difficulty relaying the hx but reports off and on dizziness regardless and sitting or standing, and may be provoked by position changes. Staff reports that she has had a decreased appetite and nausea but the nausea subsided per the resident.  Has normal BMs.  Hx of DM II CBGs range 120-150.  Ortho static BP/pulse as follows:  146/78 66 lying, 142/76 57 sitting, 140/72 61 standing.   She remains in her usual mental state, ambulatory with a walker and denies any sob, cough, or cp.  Denies urinary symptoms, or abd pain.  Weight down 4 lbs since May to 106.   Report decreased hearing but not tinnitus or otalgia.     No visual changes, has poor vision at baseline.  No one sided weakness, but generally doesn't feel well.     Past Medical History:  Diagnosis Date  . Anemia, unspecified   . Anxiety and depression   . Arthritis   . Diabetes mellitus without complication (HCC)   . Diverticulosis   . GERD (gastroesophageal reflux disease)   . Glaucoma   . Hemorrhoids   . HLD (hyperlipidemia)   . Hypothyroidism   . IBS (irritable bowel syndrome)   . Palpitations    Past Surgical History:  Procedure Laterality Date  . APPENDECTOMY    . COLONOSCOPY  04/15/10   diverticulosis, sigmoid AVM, hemorrhoids  . TONSILLECTOMY      Allergies  Allergen Reactions  . Iohexol     IV Dye  . Losartan   . Naprosyn [Naproxen]       Medication List       Accurate as of 06/06/16  4:31 PM. Always use your most recent med list.          acetaminophen 325 MG tablet Commonly known as:  TYLENOL Take 650 mg by mouth every 6 (six) hours as needed. For pain.   bimatoprost 0.01 % Soln Commonly known as:  LUMIGAN Place 1 drop into both eyes at bedtime.   [employer] VITAMIN D 2000 units Caps Generic drug:  Cholecalciferol Take 1 capsule by mouth daily. (vitamin d)   Dextromethorphan-Guaifenesin 10-100 MG/5ML liquid Take 5 mLs  by mouth every 4 (four) hours as needed (cough).   diclofenac sodium 1 % Gel Commonly known as:  VOLTAREN Apply topically. Apply bilateral to shoulders and knees as needed for pain   dorzolamide-timolol 22.3-6.8 MG/ML ophthalmic solution Commonly known as:  COSOPT Place 1 drop into both eyes every morning.   DULoxetine 60 MG capsule Commonly known as:  CYMBALTA Take 60 mg by mouth daily.   ibuprofen 200 MG tablet Commonly known as:  ADVIL,MOTRIN Take 200 mg by mouth every 8 (eight) hours as needed for moderate pain (pain).   levothyroxine 25 MCG tablet Commonly known as:  SYNTHROID, LEVOTHROID Take 37.5 mcg by mouth daily.   Lutein 20 MG Caps Take 1 capsule by mouth every  morning.   magnesium hydroxide 400 MG/5ML suspension Commonly known as:  MILK OF MAGNESIA Take by mouth. Give daily as needed for constipation   MYRBETRIQ 25 MG Tb24 tablet Generic drug:  mirabegron ER Take 25 mg by mouth daily.   nystatin cream Commonly known as:  MYCOSTATIN Apply 1 application topically 2 (two) times daily.   ondansetron 4 MG tablet Commonly known as:  ZOFRAN Take 4 mg by mouth every 6 (six) hours as needed for nausea.   polyethylene glycol powder powder Commonly known as:  GLYCOLAX/MIRALAX Take 0.5 Containers by mouth 2 (two) times daily as needed for mild constipation.   RESTASIS 0.05 % ophthalmic emulsion Generic drug:  cycloSPORINE Place 1 drop into both eyes every 12 (twelve) hours.   SYSTANE 0.4-0.3 % Soln Generic drug:  Polyethyl Glycol-Propyl Glycol Apply 1-2 drops to eye every hour as needed.   temazepam 15 MG capsule Commonly known as:  RESTORIL Take 15 mg by mouth at bedtime.   traMADol 50 MG tablet Commonly known as:  ULTRAM Take 25 mg by mouth 3 (three) times daily.       Review of Systems  Constitutional: Positive for activity change, appetite change and fatigue. Negative for chills, diaphoresis, fever and unexpected weight change.  HENT: Negative for congestion, rhinorrhea, sinus pressure, tinnitus and trouble swallowing.   Eyes: Negative for visual disturbance.  Respiratory: Negative for cough, shortness of breath and wheezing.   Cardiovascular: Negative for chest pain, palpitations and leg swelling.  Gastrointestinal: Positive for nausea. Negative for abdominal distention, abdominal pain, blood in stool, constipation, diarrhea, rectal pain and vomiting.  Genitourinary: Negative for difficulty urinating, dysuria, flank pain and frequency.  Musculoskeletal: Positive for arthralgias and gait problem.  Skin: Negative for rash and wound.  Neurological: Positive for dizziness and weakness. Negative for tremors, seizures, syncope, facial  asymmetry, speech difficulty, light-headedness, numbness and headaches.  Psychiatric/Behavioral: Positive for confusion. Negative for agitation and behavioral problems.    Immunization History  Administered Date(s) Administered  . Influenza-Unspecified 08/29/2014, 08/27/2015  . Pneumococcal Conjugate-13 08/05/2015   Pertinent  Health Maintenance Due  Topic Date Due  . FOOT EXAM  10/16/1930  . OPHTHALMOLOGY EXAM  10/16/1930  . URINE MICROALBUMIN  10/16/1930  . DEXA SCAN  10/16/1985  . HEMOGLOBIN A1C  05/25/2016  . INFLUENZA VACCINE  06/14/2016  . PNA vac Low Risk Adult (2 of 2 - PPSV23) 08/04/2016   Fall Risk  04/06/2016 04/01/2015 01/07/2015 10/22/2014  Falls in the past year? No No Yes Yes  Number falls in past yr: - - - 2 or more  Injury with Fall? - - No -  Risk for fall due to : - - History of fall(s) History of fall(s)   Functional Status Survey:  Vitals:   06/06/16 1626  BP: (!) 146/78  Pulse: 66  Resp: 20  Temp: 97.5 F (36.4 C)  SpO2: 98%  Weight: 106 lb (48.1 kg)   Body mass index is 20.7 kg/m.  Wt Readings from Last 3 Encounters:  06/06/16 106 lb (48.1 kg)  04/06/16 110 lb (49.9 kg)  12/02/15 109 lb (49.4 kg)    Physical Exam  Constitutional: No distress.  HENT:  Head: Normocephalic and atraumatic.  Right Ear: External ear normal.  Left Ear: External ear normal.  Nose: Nose normal.  Mouth/Throat: No oropharyngeal exudate.  Left TM WNL, Right TM not visualized due to was  Eyes: Conjunctivae and EOM are normal. Pupils are equal, round, and reactive to light. Right eye exhibits no discharge. Left eye exhibits no discharge.  Neck: Normal range of motion. Neck supple. No JVD present. No tracheal deviation present. No thyromegaly present.  Cardiovascular: Normal rate and regular rhythm.   No murmur heard. No edema  Pulmonary/Chest: Effort normal. She has rales (bilat mid way down).  Abdominal: Soft. Bowel sounds are normal.  Musculoskeletal: She  exhibits no edema or tenderness.  Strength 4/5 BUE and RLE, 3/5 to LLE due to knee pain  Lymphadenopathy:    She has no cervical adenopathy.  Neurological: She is alert. No cranial nerve deficit. Coordination normal.  Oriented to self, place, situation, not time; poor historian  Skin: Skin is warm and dry. She is not diaphoretic. There is pallor.  Psychiatric: She has a normal mood and affect.    Labs reviewed:  Recent Labs  11/26/15  NA 138  K 4.4  BUN 26*  CREATININE 1.0   No results for input(s): AST, ALT, ALKPHOS, BILITOT, PROT, ALBUMIN in the last 8760 hours. No results for input(s): WBC, NEUTROABS, HGB, HCT, MCV, PLT in the last 8760 hours. Lab Results  Component Value Date   TSH 3.10 03/31/2016   Lab Results  Component Value Date   HGBA1C 6.2 11/26/2015   No results found for: CHOL, HDL, LDLCALC, LDLDIRECT, TRIG, CHOLHDL  Significant Diagnostic Results in last 30 days:  No results found.  Assessment/Plan 1. Dizziness -not orthostatic, may still have some level of dehydration (difficult to obtain hx due to her dementia) -check CBC to rule out anemia, BMP to rule out electrolyte imbalance -if this continues would like her to be seen in the clinic so she can be on an exam table and perform maneuvers to elicit symptoms -no signs of acute neurologic event, most form indicates no hospitalizations  2. Nausea Intermittent, not present for our visit Consider prilosec if this continues  3. Weakness Feels weak but not obvious on exam, not focal in nature. Still able to ambulate in her apt as usual Await labs Check CXR  4. Cerumen impaction Debrox 4 gtts qhs x 3 days, then irrigate  Family/ staff Communication: discussed with staff/resident  Labs/tests ordered:  CBC BMP CXR  [name], ANP [facility] [phone]

## 2016-08-08 DIAGNOSIS — E0822 Diabetes mellitus due to underlying condition with diabetic chronic kidney disease: Secondary | ICD-10-CM | POA: Diagnosis not present

## 2016-08-08 LAB — BASIC METABOLIC PANEL
BUN: 20 mg/dL (ref 4–21)
Creatinine: 1 mg/dL (ref 0.5–1.1)
Glucose: 80 mg/dL
Potassium: 4.3 mmol/L (ref 3.4–5.3)
Sodium: 141 mmol/L (ref 137–147)

## 2016-08-08 LAB — HEMOGLOBIN A1C: Hemoglobin A1C: 6.5

## 2016-08-10 ENCOUNTER — Non-Acute Institutional Stay: Payer: Medicare Other | Admitting: Internal Medicine

## 2016-08-10 ENCOUNTER — Encounter: Payer: Self-pay | Admitting: Internal Medicine

## 2016-08-10 VITALS — BP 138/62 | HR 73 | Temp 98.0°F | Ht 60.0 in | Wt 106.0 lb

## 2016-08-10 DIAGNOSIS — F0390 Unspecified dementia without behavioral disturbance: Secondary | ICD-10-CM | POA: Diagnosis not present

## 2016-08-10 DIAGNOSIS — F419 Anxiety disorder, unspecified: Secondary | ICD-10-CM | POA: Diagnosis not present

## 2016-08-10 DIAGNOSIS — Z23 Encounter for immunization: Secondary | ICD-10-CM | POA: Diagnosis not present

## 2016-08-10 DIAGNOSIS — F5105 Insomnia due to other mental disorder: Secondary | ICD-10-CM

## 2016-08-10 DIAGNOSIS — N3281 Overactive bladder: Secondary | ICD-10-CM | POA: Diagnosis not present

## 2016-08-10 DIAGNOSIS — H409 Unspecified glaucoma: Secondary | ICD-10-CM | POA: Diagnosis not present

## 2016-08-10 DIAGNOSIS — M1712 Unilateral primary osteoarthritis, left knee: Secondary | ICD-10-CM | POA: Diagnosis not present

## 2016-08-10 NOTE — Progress Notes (Signed)
[facility]   Place of Service:  Clinic (12)  Provider: Reakwon Barren L. Renato Gailseed, D.O., C.M.D.  Code Status: DNR Goals of Care:  Advanced Directives 08/10/2016  Does patient have an advance directive? Yes  Type of Advance Directive Out of facility DNR (pink MOST or yellow form);Healthcare Power of [place];Living will  Does patient want to make changes to advanced directive? -  Copy of advanced directive(s) in chart? Yes  Pre-existing out of facility DNR order (yellow form or pink MOST form) Yellow form placed in chart (order not valid for inpatient use);Pink MOST form placed in chart (order not valid for inpatient use)     Chief Complaint  Patient presents with  . Medical Management of Chronic Issues    HPI: Patient is a 82 y.o. female seen today for medical management of chronic diseases.  [place]  Her caregiver brought her to her appt today.    She is doing fairly well.  Primary concern is her ongoing left knee and hip pain which got worse after a fall about 1.5 mos ago.  Fortunately, no fractures were noted on xrays.  She has not fallen since then.  She has not been taking any of her as needed pain medications (does not think to ask for anything).  Previously when her son was here and this was discussed, we opted not to change her pain medications b/c she refuses stronger medications b/c they make her feel poorly.    She has had diabetes, but latest hba1c wnl w/o meds.    She sleeps well, bowels are moving.  She does have dreams, sometimes nightmares.  She did not tolerate changes to her sleep medication in the past--on benzo longstanding since before I knew her.  Past Medical History:  Diagnosis Date  . Anemia, unspecified   . Anxiety and depression   . Arthritis   . Diabetes mellitus without complication (HCC)   . Diverticulosis   . GERD (gastroesophageal reflux disease)   . Glaucoma   . Hemorrhoids   . HLD (hyperlipidemia)   .  Hypothyroidism   . IBS (irritable bowel syndrome)   . Palpitations     Past Surgical History:  Procedure Laterality Date  . APPENDECTOMY    . COLONOSCOPY  04/15/10   diverticulosis, sigmoid AVM, hemorrhoids  . TONSILLECTOMY      Allergies  Allergen Reactions  . Iohexol     IV Dye  . Losartan   . Naprosyn [Naproxen]       Medication List       Accurate as of 08/10/16 11:59 PM. Always use your most recent med list.          acetaminophen 325 MG tablet Commonly known as:  TYLENOL Take 650 mg by mouth every 6 (six) hours as needed. For pain.   bimatoprost 0.01 % Soln Commonly known as:  LUMIGAN Place 1 drop into both eyes at bedtime.   CVS VITAMIN D 2000 units Caps Generic drug:  Cholecalciferol Take 1 capsule by mouth daily. (vitamin d)   diclofenac sodium 1 % Gel Commonly known as:  VOLTAREN Apply topically. Apply bilateral to shoulders and knees as needed for pain   dorzolamide-timolol 22.3-6.8 MG/ML ophthalmic solution Commonly known as:  COSOPT Place 1 drop into both eyes every morning.   DULoxetine 60 MG capsule Commonly known as:  CYMBALTA Take 60 mg by mouth daily.   ibuprofen 200 MG tablet Commonly known as:  ADVIL,MOTRIN Take 200  mg by mouth every 8 (eight) hours as needed for moderate pain (pain).   levothyroxine 25 MCG tablet Commonly known as:  SYNTHROID, LEVOTHROID Take 37.5 mcg by mouth daily.   Lutein 20 MG Caps Take 1 capsule by mouth every morning.   magnesium hydroxide 400 MG/5ML suspension Commonly known as:  MILK OF MAGNESIA Take by mouth. Give daily as needed for constipation   MYRBETRIQ 25 MG Tb24 tablet Generic drug:  mirabegron ER Take 25 mg by mouth daily.   ondansetron 4 MG tablet Commonly known as:  ZOFRAN Take 4 mg by mouth every 6 (six) hours as needed for nausea.   polyethylene glycol powder powder Commonly known as:  GLYCOLAX/MIRALAX Take 0.5 Containers by mouth 2 (two) times daily as needed for mild  constipation.   RESTASIS 0.05 % ophthalmic emulsion Generic drug:  cycloSPORINE Place 1 drop into both eyes every 12 (twelve) hours.   SYSTANE 0.4-0.3 % Soln Generic drug:  Polyethyl Glycol-Propyl Glycol Apply 1-2 drops to eye every hour as needed.   temazepam 15 MG capsule Commonly known as:  RESTORIL Take 15 mg by mouth at bedtime.   traMADol 50 MG tablet Commonly known as:  ULTRAM Take 25 mg by mouth 3 (three) times daily.       Review of Systems:  Review of Systems  Constitutional: Negative for chills, fever and malaise/fatigue.  HENT: Positive for hearing loss. Negative for congestion.   Eyes: Positive for blurred vision.  Respiratory: Negative for shortness of breath.   Cardiovascular: Negative for chest pain, palpitations and leg swelling.  Gastrointestinal: Negative for abdominal pain, blood in stool, constipation and melena.  Genitourinary: Negative for dysuria.  Musculoskeletal: Positive for back pain and joint pain. Negative for falls.  Neurological: Negative for dizziness, weakness and headaches.  Endo/Heme/Allergies: Bruises/bleeds easily.  Psychiatric/Behavioral: Positive for memory loss.    Health Maintenance  Topic Date Due  . FOOT EXAM  10/16/1928  . OPHTHALMOLOGY EXAM  10/16/1930  . URINE MICROALBUMIN  10/16/1930  . TETANUS/TDAP  10/17/1939  . ZOSTAVAX  10/16/1980  . DEXA SCAN  10/16/1985  . INFLUENZA VACCINE  06/14/2016  . HEMOGLOBIN A1C  02/05/2017  . PNA vac Low Risk Adult  Completed    Physical Exam: Vitals:   08/10/16 1323  BP: 138/62  Pulse: 73  Temp: 98 F (36.7 C)  TempSrc: Oral  SpO2: 98%  Weight: 106 lb (48.1 kg)  Height: 5' (1.524 m)   Body mass index is 20.7 kg/m. Physical Exam  Constitutional: She is oriented to person, place, and time. No distress.  Frail white female seated in wheelchair  HENT:  Head: Normocephalic and atraumatic.  Eyes:  Glasses, poor vision  Cardiovascular: Normal rate, regular rhythm, normal  heart sounds and intact distal pulses.   Pulmonary/Chest: Effort normal and breath sounds normal. No respiratory distress.  Abdominal: Soft. Bowel sounds are normal.  Musculoskeletal: Normal range of motion.  Left knee swelling medially, chronic deformity  Neurological: She is alert and oriented to person, place, and time.  Skin: Skin is warm and dry.  Psychiatric: She has a normal mood and affect.    Labs reviewed: Basic Metabolic Panel:  Recent Labs  42/87/68 03/31/16 06/06/16 0508 08/08/16 0300  NA 138  --  136* 141  K 4.4  --  4.4 4.3  BUN 26*  --  24* 20  CREATININE 1.0  --  1.0 1.0  TSH  --  3.10  --   --  Liver Function Tests: No results for input(s): AST, ALT, ALKPHOS, BILITOT, PROT, ALBUMIN in the last 8760 hours. No results for input(s): LIPASE, AMYLASE in the last 8760 hours. No results for input(s): AMMONIA in the last 8760 hours. CBC:  Recent Labs  06/06/16 0508  WBC 8.5  HGB 11.9*  HCT 37  PLT 222   Lipid Panel: No results for input(s): CHOL, HDL, LDLCALC, TRIG, CHOLHDL, LDLDIRECT in the last 8760 hours. Lab Results  Component Value Date   HGBA1C 6.5 08/08/2016    Assessment/Plan 1. Dementia, without behavioral disturbance -mild, not on meds, lives in [place] and has caregiver assistance   2. OAB (overactive bladder) -cont myrbetriq which did help frequency to some extent  3. Primary osteoarthritis of left knee -chronic, won't take more medications and does not like how stronger ones make her feel--most prn meds are not used and have been discontinued as a result  4. Insomnia secondary to anxiety -continues temazepam and has not done well with previous attempts at titration  5. Glaucoma -cont current treatments, unfortunately has affected her function significantly and cognition is still fairly good  6. Need for pneumococcal vaccination - Pneumococcal polysaccharide vaccine 23-valent greater than or equal to 2yo subcutaneous/IM  Labs/tests  ordered:   Orders Placed This Encounter  Procedures  . Pneumococcal polysaccharide vaccine 23-valent greater than or equal to 2yo subcutaneous/IM  . Basic metabolic panel    This external order was created through the Results Console.  . Hemoglobin A1c    This external order was created through the Results Console.  . CBC and differential    This external order was created through the Results Console.  . Basic metabolic panel    This external order was created through the Results Console.    Next appt:  12/07/2016 med mgt  Charmeka Freeburg L. Bogdan Vivona, D.O. Geriatrics [facility] [address], [place] [address] Cell Phone (Mon-Fri 8am-5pm):  [phone] On Call:  [phone] & follow prompts after 5pm & weekends Office Phone:  [phone] Office Fax:  [phone]

## 2016-11-25 ENCOUNTER — Observation Stay (HOSPITAL_COMMUNITY)
Admission: EM | Admit: 2016-11-25 | Discharge: 2016-11-26 | Disposition: A | Discharge status: Skilled Nursing Facility | Payer: Medicare Other | Attending: Family Medicine | Admitting: Family Medicine

## 2016-11-25 ENCOUNTER — Encounter (HOSPITAL_COMMUNITY): Payer: Self-pay

## 2016-11-25 ENCOUNTER — Emergency Department (HOSPITAL_COMMUNITY): Payer: Medicare Other

## 2016-11-25 DIAGNOSIS — I639 Cerebral infarction, unspecified: Secondary | ICD-10-CM

## 2016-11-25 DIAGNOSIS — F0390 Unspecified dementia without behavioral disturbance: Secondary | ICD-10-CM | POA: Insufficient documentation

## 2016-11-25 DIAGNOSIS — Z888 Allergy status to other drugs, medicaments and biological substances status: Secondary | ICD-10-CM | POA: Diagnosis not present

## 2016-11-25 DIAGNOSIS — Z886 Allergy status to analgesic agent status: Secondary | ICD-10-CM | POA: Insufficient documentation

## 2016-11-25 DIAGNOSIS — E785 Hyperlipidemia, unspecified: Secondary | ICD-10-CM | POA: Insufficient documentation

## 2016-11-25 DIAGNOSIS — Z91041 Radiographic dye allergy status: Secondary | ICD-10-CM | POA: Insufficient documentation

## 2016-11-25 DIAGNOSIS — F5105 Insomnia due to other mental disorder: Secondary | ICD-10-CM | POA: Insufficient documentation

## 2016-11-25 DIAGNOSIS — R29710 NIHSS score 10: Secondary | ICD-10-CM | POA: Diagnosis not present

## 2016-11-25 DIAGNOSIS — R4781 Slurred speech: Secondary | ICD-10-CM

## 2016-11-25 DIAGNOSIS — N3281 Overactive bladder: Secondary | ICD-10-CM | POA: Insufficient documentation

## 2016-11-25 DIAGNOSIS — G459 Transient cerebral ischemic attack, unspecified: Principal | ICD-10-CM | POA: Insufficient documentation

## 2016-11-25 DIAGNOSIS — I10 Essential (primary) hypertension: Secondary | ICD-10-CM | POA: Diagnosis not present

## 2016-11-25 DIAGNOSIS — Z66 Do not resuscitate: Secondary | ICD-10-CM | POA: Diagnosis not present

## 2016-11-25 DIAGNOSIS — Z823 Family history of stroke: Secondary | ICD-10-CM | POA: Diagnosis not present

## 2016-11-25 DIAGNOSIS — E119 Type 2 diabetes mellitus without complications: Secondary | ICD-10-CM | POA: Insufficient documentation

## 2016-11-25 DIAGNOSIS — Z86010 Personal history of colonic polyps: Secondary | ICD-10-CM | POA: Insufficient documentation

## 2016-11-25 DIAGNOSIS — E039 Hypothyroidism, unspecified: Secondary | ICD-10-CM | POA: Insufficient documentation

## 2016-11-25 DIAGNOSIS — H409 Unspecified glaucoma: Secondary | ICD-10-CM | POA: Insufficient documentation

## 2016-11-25 DIAGNOSIS — M1712 Unilateral primary osteoarthritis, left knee: Secondary | ICD-10-CM | POA: Insufficient documentation

## 2016-11-25 DIAGNOSIS — R531 Weakness: Secondary | ICD-10-CM

## 2016-11-25 DIAGNOSIS — F419 Anxiety disorder, unspecified: Secondary | ICD-10-CM | POA: Diagnosis not present

## 2016-11-25 DIAGNOSIS — M19011 Primary osteoarthritis, right shoulder: Secondary | ICD-10-CM | POA: Insufficient documentation

## 2016-11-25 DIAGNOSIS — F329 Major depressive disorder, single episode, unspecified: Secondary | ICD-10-CM | POA: Insufficient documentation

## 2016-11-25 DIAGNOSIS — Z7982 Long term (current) use of aspirin: Secondary | ICD-10-CM | POA: Diagnosis not present

## 2016-11-25 DIAGNOSIS — M19012 Primary osteoarthritis, left shoulder: Secondary | ICD-10-CM | POA: Diagnosis not present

## 2016-11-25 DIAGNOSIS — H9190 Unspecified hearing loss, unspecified ear: Secondary | ICD-10-CM | POA: Diagnosis not present

## 2016-11-25 DIAGNOSIS — K589 Irritable bowel syndrome without diarrhea: Secondary | ICD-10-CM | POA: Insufficient documentation

## 2016-11-25 DIAGNOSIS — K219 Gastro-esophageal reflux disease without esophagitis: Secondary | ICD-10-CM | POA: Diagnosis not present

## 2016-11-25 DIAGNOSIS — G451 Carotid artery syndrome (hemispheric): Secondary | ICD-10-CM | POA: Diagnosis not present

## 2016-11-25 DIAGNOSIS — M6281 Muscle weakness (generalized): Secondary | ICD-10-CM | POA: Diagnosis not present

## 2016-11-25 DIAGNOSIS — F32A Depression, unspecified: Secondary | ICD-10-CM | POA: Diagnosis present

## 2016-11-25 LAB — CBC
HCT: 35.1 % — ABNORMAL LOW (ref 36.0–46.0)
Hemoglobin: 11.3 g/dL — ABNORMAL LOW (ref 12.0–15.0)
MCH: 28.3 pg (ref 26.0–34.0)
MCHC: 32.2 g/dL (ref 30.0–36.0)
MCV: 88 fL (ref 78.0–100.0)
Platelets: 228 10*3/uL (ref 150–400)
RBC: 3.99 MIL/uL (ref 3.87–5.11)
RDW: 13.8 % (ref 11.5–15.5)
WBC: 10.4 10*3/uL (ref 4.0–10.5)

## 2016-11-25 LAB — URINALYSIS, ROUTINE W REFLEX MICROSCOPIC
Bilirubin Urine: NEGATIVE
Glucose, UA: NEGATIVE mg/dL
Hgb urine dipstick: NEGATIVE
Ketones, ur: NEGATIVE mg/dL
Leukocytes, UA: NEGATIVE
Nitrite: NEGATIVE
Protein, ur: NEGATIVE mg/dL
Specific Gravity, Urine: 1.011 (ref 1.005–1.030)
pH: 7 (ref 5.0–8.0)

## 2016-11-25 LAB — I-STAT CHEM 8, ED
BUN: 23 mg/dL — ABNORMAL HIGH (ref 6–20)
Calcium, Ion: 1.12 mmol/L — ABNORMAL LOW (ref 1.15–1.40)
Chloride: 100 mmol/L — ABNORMAL LOW (ref 101–111)
Creatinine, Ser: 1.1 mg/dL — ABNORMAL HIGH (ref 0.44–1.00)
Glucose, Bld: 103 mg/dL — ABNORMAL HIGH (ref 65–99)
HCT: 37 % (ref 36.0–46.0)
Hemoglobin: 12.6 g/dL (ref 12.0–15.0)
Potassium: 4 mmol/L (ref 3.5–5.1)
Sodium: 137 mmol/L (ref 135–145)
TCO2: 27 mmol/L (ref 0–100)

## 2016-11-25 LAB — DIFFERENTIAL
Basophils Absolute: 0 10*3/uL (ref 0.0–0.1)
Basophils Relative: 0 %
Eosinophils Absolute: 0.2 10*3/uL (ref 0.0–0.7)
Eosinophils Relative: 2 %
Lymphocytes Relative: 46 %
Lymphs Abs: 4.8 10*3/uL — ABNORMAL HIGH (ref 0.7–4.0)
Monocytes Absolute: 0.7 10*3/uL (ref 0.1–1.0)
Monocytes Relative: 7 %
Neutro Abs: 4.7 10*3/uL (ref 1.7–7.7)
Neutrophils Relative %: 45 %

## 2016-11-25 LAB — COMPREHENSIVE METABOLIC PANEL
ALT: 11 U/L — ABNORMAL LOW (ref 14–54)
AST: 19 U/L (ref 15–41)
Albumin: 3.4 g/dL — ABNORMAL LOW (ref 3.5–5.0)
Alkaline Phosphatase: 74 U/L (ref 38–126)
Anion gap: 5 (ref 5–15)
BUN: 19 mg/dL (ref 6–20)
CO2: 30 mmol/L (ref 22–32)
Calcium: 8.9 mg/dL (ref 8.9–10.3)
Chloride: 102 mmol/L (ref 101–111)
Creatinine, Ser: 1.06 mg/dL — ABNORMAL HIGH (ref 0.44–1.00)
GFR calc Af Amer: 50 mL/min — ABNORMAL LOW (ref >60)
GFR calc non Af Amer: 43 mL/min — ABNORMAL LOW (ref >60)
Glucose, Bld: 106 mg/dL — ABNORMAL HIGH (ref 65–99)
Potassium: 4.1 mmol/L (ref 3.5–5.1)
Sodium: 137 mmol/L (ref 135–145)
Total Bilirubin: 0.5 mg/dL (ref 0.3–1.2)
Total Protein: 6.3 g/dL — ABNORMAL LOW (ref 6.5–8.1)

## 2016-11-25 LAB — CBG MONITORING, ED: Glucose-Capillary: 100 mg/dL — ABNORMAL HIGH (ref 65–99)

## 2016-11-25 LAB — PROTIME-INR
INR: 1.05
Prothrombin Time: 13.7 seconds (ref 11.4–15.2)

## 2016-11-25 LAB — I-STAT TROPONIN, ED: Troponin i, poc: 0 ng/mL (ref 0.00–0.08)

## 2016-11-25 LAB — APTT: aPTT: 33 seconds (ref 24–36)

## 2016-11-25 MED ORDER — DICLOFENAC SODIUM 1 % TD GEL
2.0000 g | Freq: Four times a day (QID) | TRANSDERMAL | Status: DC | PRN
  Filled 2016-11-25: qty 100

## 2016-11-25 MED ORDER — TEMAZEPAM 7.5 MG PO CAPS
15.0000 mg | ORAL_CAPSULE | Freq: Every day | ORAL | Status: DC
  Administered 2016-11-26: 15 mg via ORAL

## 2016-11-25 MED ORDER — ACETAMINOPHEN 325 MG PO TABS: 650.0000 mg | ORAL_TABLET | Freq: Four times a day (QID) | ORAL | Status: DC | PRN

## 2016-11-25 MED ORDER — CYCLOSPORINE 0.05 % OP EMUL
1.0000 [drp] | Freq: Two times a day (BID) | OPHTHALMIC | Status: DC
  Administered 2016-11-26 (×2): 1 [drp] via OPHTHALMIC
  Filled 2016-11-25 (×3): qty 1

## 2016-11-25 MED ORDER — TRAMADOL HCL 50 MG PO TABS
25.0000 mg | ORAL_TABLET | Freq: Three times a day (TID) | ORAL | Status: DC
  Administered 2016-11-26 (×2): 25 mg via ORAL
  Filled 2016-11-25 (×2): qty 1

## 2016-11-25 MED ORDER — SODIUM CHLORIDE 0.9 % IV SOLN
INTRAVENOUS | Status: DC
  Administered 2016-11-26: 01:00:00 via INTRAVENOUS

## 2016-11-25 MED ORDER — MAGNESIUM HYDROXIDE 400 MG/5ML PO SUSP: 5.0000 mL | Freq: Every day | ORAL | Status: DC | PRN

## 2016-11-25 MED ORDER — ENOXAPARIN SODIUM 30 MG/0.3ML ~~LOC~~ SOLN
30.0000 mg | SUBCUTANEOUS | Status: DC
  Administered 2016-11-26: 30 mg via SUBCUTANEOUS
  Filled 2016-11-25: qty 0.3

## 2016-11-25 MED ORDER — STROKE: EARLY STAGES OF RECOVERY BOOK
Freq: Once | Status: AC
  Administered 2016-11-26: 01:00:00

## 2016-11-25 MED ORDER — LUTEIN 20 MG PO CAPS: 1.0000 | ORAL_CAPSULE | Freq: Every morning | ORAL | Status: DC

## 2016-11-25 MED ORDER — POLYVINYL ALCOHOL 1.4 % OP SOLN: 1.0000 [drp] | OPHTHALMIC | Status: DC | PRN

## 2016-11-25 MED ORDER — MIRABEGRON ER 25 MG PO TB24
25.0000 mg | ORAL_TABLET | Freq: Every day | ORAL | Status: DC
  Administered 2016-11-26: 25 mg via ORAL
  Filled 2016-11-25: qty 1

## 2016-11-25 MED ORDER — DULOXETINE HCL 60 MG PO CPEP
60.0000 mg | ORAL_CAPSULE | Freq: Every day | ORAL | Status: DC
  Administered 2016-11-26 (×2): 60 mg via ORAL
  Filled 2016-11-25 (×2): qty 1

## 2016-11-25 MED ORDER — SENNOSIDES-DOCUSATE SODIUM 8.6-50 MG PO TABS: 1.0000 | ORAL_TABLET | Freq: Every evening | ORAL | Status: DC | PRN

## 2016-11-25 MED ORDER — ONDANSETRON HCL 4 MG PO TABS: 4.0000 mg | ORAL_TABLET | Freq: Four times a day (QID) | ORAL | Status: DC | PRN

## 2016-11-25 MED ORDER — ZOLPIDEM TARTRATE 5 MG PO TABS: 5.0000 mg | ORAL_TABLET | Freq: Every evening | ORAL | Status: DC | PRN

## 2016-11-25 MED ORDER — DORZOLAMIDE HCL-TIMOLOL MAL 2-0.5 % OP SOLN: 1.0000 [drp] | Freq: Every morning | OPHTHALMIC | Status: DC

## 2016-11-25 MED ORDER — POLYETHYLENE GLYCOL 3350 17 GM/SCOOP PO POWD: 0.5000 | Freq: Two times a day (BID) | ORAL | Status: DC | PRN

## 2016-11-25 MED ORDER — LEVOTHYROXINE SODIUM 75 MCG PO TABS
37.5000 ug | ORAL_TABLET | Freq: Every day | ORAL | Status: DC
  Administered 2016-11-26: 37.5 ug via ORAL
  Filled 2016-11-25: qty 1

## 2016-11-25 MED ORDER — VITAMIN D 1000 UNITS PO TABS
2000.0000 [IU] | ORAL_TABLET | Freq: Every day | ORAL | Status: DC
Start: 2016-11-26 — End: 2016-11-26
  Administered 2016-11-26: 2000 [IU] via ORAL
  Filled 2016-11-25: qty 2

## 2016-11-25 MED ORDER — DORZOLAMIDE HCL 2 % OP SOLN
1.0000 [drp] | Freq: Every day | OPHTHALMIC | Status: DC
  Administered 2016-11-26: 1 [drp] via OPHTHALMIC
  Filled 2016-11-25: qty 10

## 2016-11-25 MED ORDER — LATANOPROST 0.005 % OP SOLN
1.0000 [drp] | Freq: Every day | OPHTHALMIC | Status: DC
  Administered 2016-11-26: 1 [drp] via OPHTHALMIC
  Filled 2016-11-25: qty 2.5

## 2016-11-25 MED ORDER — ATORVASTATIN CALCIUM 40 MG PO TABS: 40.0000 mg | ORAL_TABLET | Freq: Every day | ORAL | Status: DC

## 2016-11-25 MED ORDER — ASPIRIN 325 MG PO TABS
325.0000 mg | ORAL_TABLET | Freq: Every day | ORAL | Status: DC
  Administered 2016-11-26: 325 mg via ORAL
  Filled 2016-11-25 (×2): qty 1

## 2016-11-25 MED ORDER — ASPIRIN 300 MG RE SUPP
300.0000 mg | Freq: Every day | RECTAL | Status: DC
  Administered 2016-11-26: 300 mg via RECTAL

## 2016-11-25 MED ORDER — TIMOLOL MALEATE 0.5 % OP SOLN
1.0000 [drp] | Freq: Every day | OPHTHALMIC | Status: DC
  Administered 2016-11-26: 1 [drp] via OPHTHALMIC
  Filled 2016-11-25: qty 5

## 2016-11-25 NOTE — H&P (Signed)
History and Physical    [name] MBE:[id number] DOB: August 15, 1918 DOA: 11/25/2016  Referring MD/NP/PA:   PCP: Bufford Spikes, DO   Patient coming from:  The patient is coming from SNF.  At baseline, pt is dependent for most of ADL.  Chief Complaint: AMS, right-sided weakness and slurry speech.  HPI: [name] is a 83 y.o. female with medical history significant of hypertension, diet-controlled diabetes, GERD, hypothyroidism, depression, anxiety, IBS, diverticulosis, dementia, who presents with altered mental status, right-sided weakness and slurred speech.  Per report, pt was LSN at 15:30 and took a nap, woke with confusion, right-sided weakness and slurry speech at about 17:14. Per her granddaughter, at baseline patient is able to walk with walker and needs assistance with dressing and toileting. When I saw pt in ED, she is confused, not oriented x 3 and had difficulty following commands. Pt dose not have active coughing, nausea, vomiting. No diarrhea noted. Patient does not seem to have chest pain, difficulty breathing, fever or chills. I spoke to her son on the phone. He reported that patient typically gets confused after a nap. He refused MRI of brain.   ED Course: pt was found to have INR 1.05, negative troponin, negative urinalysis, WBC 10.4, creatinine 1.06, negative CT head, temperature normal, bradycardia, oxygen saturation 96% on room air. Patient is placed on telemetry bed for observation. Neurology was consulted.  Review of Systems: Could not be reviewed accurately due to altered mental status and dementia.  Allergy:  Allergies  Allergen Reactions  . Iohexol     IV Dye  . Losartan   . Naprosyn [Naproxen]     Past Medical History:  Diagnosis Date  . Anemia, unspecified   . Anxiety and depression   . Arthritis   . Diabetes mellitus without complication (HCC)   . Diverticulosis   . GERD (gastroesophageal reflux disease)   . Glaucoma   . Hemorrhoids   .  HLD (hyperlipidemia)   . Hypothyroidism   . IBS (irritable bowel syndrome)   . Palpitations     Past Surgical History:  Procedure Laterality Date  . APPENDECTOMY    . COLONOSCOPY  04/15/10   diverticulosis, sigmoid AVM, hemorrhoids  . TONSILLECTOMY      Social History:  reports that she has never smoked. She has never used smokeless tobacco. She reports that she does not drink alcohol or use drugs.  Family History:  Family History  Problem Relation Age of Onset  . Colon cancer Father 11  . Diabetes Mother   . Colon cancer Mother   . Heart attack Brother   . Parkinsonism Brother   . CVA Sister   . Colon cancer Paternal Uncle   . Colon cancer Paternal Grandfather      Prior to Admission medications   Medication Sig Start Date End Date Taking? Authorizing Provider  acetaminophen (TYLENOL) 325 MG tablet Take 650 mg by mouth every 6 (six) hours as needed. For pain.    Historical Provider, MD  bimatoprost (LUMIGAN) 0.01 % SOLN Place 1 drop into both eyes at bedtime.     Historical Provider, MD  Cholecalciferol ([employer] VITAMIN D) 2000 UNITS CAPS Take 1 capsule by mouth daily. (vitamin d)    Historical Provider, MD  diclofenac sodium (VOLTAREN) 1 % GEL Apply topically. Apply bilateral to shoulders and knees as needed for pain    Historical Provider, MD  dorzolamide-timolol (COSOPT) 22.3-6.8 MG/ML ophthalmic solution Place 1 drop into both eyes every morning.  Historical Provider, MD  DULoxetine (CYMBALTA) 60 MG capsule Take 60 mg by mouth daily.  03/12/15   Historical Provider, MD  ibuprofen (ADVIL,MOTRIN) 200 MG tablet Take 200 mg by mouth every 8 (eight) hours as needed for moderate pain (pain).     Historical Provider, MD  levothyroxine (SYNTHROID, LEVOTHROID) 25 MCG tablet Take 37.5 mcg by mouth daily.     Historical Provider, MD  Lutein 20 MG CAPS Take 1 capsule by mouth every morning.    Historical Provider, MD  magnesium hydroxide (MILK OF MAGNESIA) 400 MG/5ML suspension Take by  mouth. Give daily as needed for constipation    Historical Provider, MD  MYRBETRIQ 25 MG TB24 tablet Take 25 mg by mouth daily.  12/26/14   Historical Provider, MD  ondansetron (ZOFRAN) 4 MG tablet Take 4 mg by mouth every 6 (six) hours as needed for nausea.  11/23/14   Historical Provider, MD  Polyethyl Glycol-Propyl Glycol (SYSTANE) 0.4-0.3 % SOLN Apply 1-2 drops to eye every hour as needed.    Historical Provider, MD  polyethylene glycol powder (GLYCOLAX/MIRALAX) powder Take 0.5 Containers by mouth 2 (two) times daily as needed for mild constipation.  08/09/13   Historical Provider, MD  RESTASIS 0.05 % ophthalmic emulsion Place 1 drop into both eyes every 12 (twelve) hours.  09/05/13   Historical Provider, MD  temazepam (RESTORIL) 15 MG capsule Take 15 mg by mouth at bedtime.  10/03/14   Historical Provider, MD  traMADol (ULTRAM) 50 MG tablet Take 25 mg by mouth 3 (three) times daily.  10/27/14   Historical Provider, MD    Physical Exam: Vitals:   11/25/16 2000 11/25/16 2030 11/25/16 2045 11/25/16 2100  BP: 121/87 164/73  117/90  Pulse: (!) 58 (!) 58  64  Resp: 20 14    Temp:   97.7 F (36.5 C) 97.9 F (36.6 C)  TempSrc:   Oral Oral  SpO2: 96% 98%  98%  Weight:       General: Not in acute distress HEENT:       Eyes: PERRL, EOMI, no scleral icterus.       ENT: No discharge from the ears and nose, no pharynx injection, no tonsillar enlargement.        Neck: No JVD, no bruit, no mass felt. Heme: No neck lymph node enlargement. Cardiac: S1/S2, RRR, No murmurs, No gallops or rubs. Respiratory: No rales, wheezing, rhonchi or rubs. GI: Soft, nondistended, nontender, no rebound pain, no organomegaly, BS present. GU: No hematuria Ext: No pitting leg edema bilaterally. 2+DP/PT pulse bilaterally. Musculoskeletal: No joint deformities, No joint redness or warmth, no limitation of ROM in spin. Skin: No rashes.  Neuro: confused, not riented X3, cranial nerves II-XII grossly intact, difficult to  assess muscle strength since patient does not follow commands Psych: Patient is not psychotic, no suicidal or hemocidal ideation.  Labs on Admission: I have personally reviewed following labs and imaging studies  CBC:  Recent Labs Lab 11/25/16 1814 11/25/16 1821  WBC 10.4  --   NEUTROABS 4.7  --   HGB 11.3* 12.6  HCT 35.1* 37.0  MCV 88.0  --   PLT 228  --    Basic Metabolic Panel:  Recent Labs Lab 11/25/16 1814 11/25/16 1821  NA 137 137  K 4.1 4.0  CL 102 100*  CO2 30  --   GLUCOSE 106* 103*  BUN 19 23*  CREATININE 1.06* 1.10*  CALCIUM 8.9  --    GFR: Estimated Creatinine Clearance:  21.5 mL/min (by C-G formula based on SCr of 1.1 mg/dL (H)). Liver Function Tests:  Recent Labs Lab 11/25/16 1814  AST 19  ALT 11*  ALKPHOS 74  BILITOT 0.5  PROT 6.3*  ALBUMIN 3.4*   No results for input(s): LIPASE, AMYLASE in the last 168 hours. No results for input(s): AMMONIA in the last 168 hours. Coagulation Profile:  Recent Labs Lab 11/25/16 1814  INR 1.05   Cardiac Enzymes: No results for input(s): CKTOTAL, CKMB, CKMBINDEX, TROPONINI in the last 168 hours. BNP (last 3 results) No results for input(s): PROBNP in the last 8760 hours. HbA1C: No results for input(s): HGBA1C in the last 72 hours. CBG:  Recent Labs Lab 11/25/16 1813  GLUCAP 100*   Lipid Profile: No results for input(s): CHOL, HDL, LDLCALC, TRIG, CHOLHDL, LDLDIRECT in the last 72 hours. Thyroid Function Tests: No results for input(s): TSH, T4TOTAL, FREET4, T3FREE, THYROIDAB in the last 72 hours. Anemia Panel: No results for input(s): VITAMINB12, FOLATE, FERRITIN, TIBC, IRON, RETICCTPCT in the last 72 hours. Urine analysis:    Component Value Date/Time   COLORURINE YELLOW 11/25/2016 1920   APPEARANCEUR CLEAR 11/25/2016 1920   LABSPEC 1.011 11/25/2016 1920   PHURINE 7.0 11/25/2016 1920   GLUCOSEU NEGATIVE 11/25/2016 1920   HGBUR NEGATIVE 11/25/2016 1920   BILIRUBINUR NEGATIVE 11/25/2016 1920    KETONESUR NEGATIVE 11/25/2016 1920   PROTEINUR NEGATIVE 11/25/2016 1920   NITRITE NEGATIVE 11/25/2016 1920   LEUKOCYTESUR NEGATIVE 11/25/2016 1920   Sepsis Labs: @LABRCNTIP (procalcitonin:4,lacticidven:4) )No results found for this or any previous visit (from the past 240 hour(s)).   Radiological Exams on Admission: Ct Head Code Stroke W/o Cm  Result Date: 11/25/2016 CLINICAL DATA:  Code stroke. Slurred speech and right-sided weakness with altered mental status. EXAM: CT HEAD WITHOUT CONTRAST TECHNIQUE: Contiguous axial images were obtained from the base of the skull through the vertex without intravenous contrast. COMPARISON:  None. FINDINGS: Brain: No evidence of acute infarction, hemorrhage, hydrocephalus, extra-axial collection or mass lesion/mass effect. Patchy hypoattenuation of subcortical and periventricular white matter probably represents moderate chronic microvascular ischemic changes. There is moderate brain parenchymal volume loss. Vascular: No hyperdense vessel. Extensive calcific atherosclerosis of cavernous internal carotid arteries. Skull: Normal. Negative for fracture or focal lesion. Sinuses/Orbits: No acute finding. Bilateral intra-ocular lens replacement. Other: None. ASPECTS [facility] Stroke Program Early CT Score) - Ganglionic level infarction (caudate, lentiform nuclei, internal capsule, insula, M1-M3 cortex): 7 - Supraganglionic infarction (M4-M6 cortex): 3 Total score (0-10 with 10 being normal): 10 IMPRESSION: 1. No acute intracranial abnormality identified. 2. ASPECTS is 10 3. Moderate chronic microvascular ischemic changes and parenchymal volume loss of the brain. These results were called by telephone at the time of interpretation on 11/25/2016 at 6:34 pm to Dr. Grace Isaac, who verbally acknowledged these results. Electronically Signed   By: Mitzi Hansen M.D.   On: 11/25/2016 18:36     EKG: Independently reviewed.  Sinus rhythm, QTC 426, LAD, poor R-wave  progression. Assessment/Plan Principal Problem:   TIA (transient ischemic attack) Active Problems:   Hypothyroidism   HLD (hyperlipidemia)   Depression   Dementia   Controlled type 2 diabetes mellitus without complication, without long-term current use of insulin (HCC)   Right sided weakness   TIA (transient ischemic attack): Patient's symptoms are concerning for TIA. CT head is negative. Neurology was consulted, recommended TIA workup. I talked to her son on the phone, who refused MRI of brain.  - will place on tele bed for obs - Appreciate Dr.  Toqeer consultation -Atrial fibrillation: not present  - Risk factor modification: HgbA1c, fasting lipid panel - PT consult, OT consult - Bedside swallowing screen was ordered, will get speech consult in AM - 2 d Echocardiogram  - Carotid dopplers  - Aspirin  Hypothyroidism: Last TSH was 3.01 on 03/31/16 -Continue home Synthroid  HLD: Last LDL was not on record -Check FLP -start lipitor  Depression:  -Continue home medications: Cymbalta  Diet controled DM-II: Last A1c 6.5 on 08/08/16, well controled. Patient is not taking meds at home. Blood sugar is 106. -check CBG every morning.  DVT ppx: SQ Lovenox Code Status: Full code Family Communication: Yes, patient's grandson and granddaughter at bedside; and daughter and son by phone Disposition Plan:  Anticipate discharge back to previous SNF environment Consults called:  neurology, Dr. Grace Isaac Admission status: Obs / tele       Date of Service 11/25/2016    Lorretta Harp [facility] Pager [phone]  If 7PM-7AM, please contact night-coverage [email or web address] Password [facility] 11/25/2016, 10:23 PM

## 2016-11-25 NOTE — ED Provider Notes (Signed)
MSE was initiated and I personally evaluated the patient and placed orders (if any) at  6:14 PM on November 25, 2016.  The patient appears stable so that the remainder of the MSE may be completed by another provider. Patient arrived as code stroke. Reportedly not following commands with slurred speech earlier today. On exam she is handling secretions well. In no respiratory distress. Stable for transport to radiology suite   Doug Sou, MD 11/25/16 1814

## 2016-11-25 NOTE — ED Provider Notes (Signed)
[facility]-EMERGENCY DEPT Provider Note   CSN: [id number] Arrival date & time: 11/25/16  1810     History   Chief Complaint Chief Complaint  Patient presents with  . Altered Mental Status    HPI [name] is a 83 y.o. female.  The history is provided by the patient and medical records.  Altered Mental Status   Associated symptoms include confusion and weakness.    83 year old female with history of anemia, anxiety, arthritis, diabetes, GERD, hemorrhoids, hyperlipidemia, hypothyroidism, presenting to the ED as a code stroke. Patient comes from facility. She laid down for a nap at 3:30 PM and woke up around 5:15 and was found to have right-sided weakness, confusion, and slurred speech. EMS was called and declared code stroke. After contacting patient's son who is her power of attorney, it appears this is baseline for her when first waking up and can last for several hours. Here patient is awake and alert. She is hard of hearing and unfortunately does not have her hearing aids in.  Past Medical History:  Diagnosis Date  . Anemia, unspecified   . Anxiety and depression   . Arthritis   . Diabetes mellitus without complication (HCC)   . Diverticulosis   . GERD (gastroesophageal reflux disease)   . Glaucoma   . Hemorrhoids   . HLD (hyperlipidemia)   . Hypothyroidism   . IBS (irritable bowel syndrome)   . Palpitations     Patient Active Problem List   Diagnosis Date Noted  . Insomnia secondary to anxiety 04/11/2016  . OAB (overactive bladder) 04/11/2016  . Primary osteoarthritis of left knee 12/02/2015  . Primary osteoarthritis of both shoulders 12/02/2015  . Controlled type 2 diabetes mellitus without complication, without long-term current use of insulin (HCC) 12/02/2015  . ST (skin tag) 12/02/2015  . Osteoarthritis, shoulder 10/27/2014  . Dementia 10/22/2014  . Hypothyroidism 03/29/2010  . COLONIC POLYPS, ADENOMATOUS, HX OF 03/24/2009  . Irritable bowel syndrome  07/24/2008  . HYPERLIPIDEMIA 02/26/2008  . ANXIETY 02/26/2008  . Depression 02/26/2008  . Glaucoma 02/26/2008  . GERD 02/26/2008    Past Surgical History:  Procedure Laterality Date  . APPENDECTOMY    . COLONOSCOPY  04/15/10   diverticulosis, sigmoid AVM, hemorrhoids  . TONSILLECTOMY      OB History    No data available       Home Medications    Prior to Admission medications   Medication Sig Start Date End Date Taking? Authorizing Provider  acetaminophen (TYLENOL) 325 MG tablet Take 650 mg by mouth every 6 (six) hours as needed. For pain.    Historical Provider, MD  bimatoprost (LUMIGAN) 0.01 % SOLN Place 1 drop into both eyes at bedtime.     Historical Provider, MD  Cholecalciferol ([employer]S VITAMIN D) 2000 UNITS CAPS Take 1 capsule by mouth daily. (vitamin d)    Historical Provider, MD  diclofenac sodium (VOLTAREN) 1 % GEL Apply topically. Apply bilateral to shoulders and knees as needed for pain    Historical Provider, MD  dorzolamide-timolol (COSOPT) 22.3-6.8 MG/ML ophthalmic solution Place 1 drop into both eyes every morning.     Historical Provider, MD  DULoxetine (CYMBALTA) 60 MG capsule Take 60 mg by mouth daily.  03/12/15   Historical Provider, MD  ibuprofen (ADVIL,MOTRIN) 200 MG tablet Take 200 mg by mouth every 8 (eight) hours as needed for moderate pain (pain).     Historical Provider, MD  levothyroxine (SYNTHROID, LEVOTHROID) 25 MCG tablet Take 37.5 mcg by mouth daily.  Historical Provider, MD  Lutein 20 MG CAPS Take 1 capsule by mouth every morning.    Historical Provider, MD  magnesium hydroxide (MILK OF MAGNESIA) 400 MG/5ML suspension Take by mouth. Give daily as needed for constipation    Historical Provider, MD  MYRBETRIQ 25 MG TB24 tablet Take 25 mg by mouth daily.  12/26/14   Historical Provider, MD  ondansetron (ZOFRAN) 4 MG tablet Take 4 mg by mouth every 6 (six) hours as needed for nausea.  11/23/14   Historical Provider, MD  Polyethyl Glycol-Propyl Glycol  (SYSTANE) 0.4-0.3 % SOLN Apply 1-2 drops to eye every hour as needed.    Historical Provider, MD  polyethylene glycol powder (GLYCOLAX/MIRALAX) powder Take 0.5 Containers by mouth 2 (two) times daily as needed for mild constipation.  08/09/13   Historical Provider, MD  RESTASIS 0.05 % ophthalmic emulsion Place 1 drop into both eyes every 12 (twelve) hours.  09/05/13   Historical Provider, MD  temazepam (RESTORIL) 15 MG capsule Take 15 mg by mouth at bedtime.  10/03/14   Historical Provider, MD  traMADol (ULTRAM) 50 MG tablet Take 25 mg by mouth 3 (three) times daily.  10/27/14   Historical Provider, MD    Family History Family History  Problem Relation Age of Onset  . Colon cancer Father 38  . Diabetes Mother   . Colon cancer Mother   . Heart attack Brother   . Parkinsonism Brother   . CVA Sister   . Colon cancer Paternal Uncle   . Colon cancer Paternal Grandfather     Social History Social History  Substance Use Topics  . Smoking status: Never Smoker  . Smokeless tobacco: Never Used  . Alcohol use No     Allergies   Iohexol; Losartan; and Naprosyn [naproxen]   Review of Systems Review of Systems  Neurological: Positive for speech difficulty and weakness.  Psychiatric/Behavioral: Positive for confusion.  All other systems reviewed and are negative.    Physical Exam Updated Vital Signs BP 173/71 (BP Location: Right Arm)   Pulse (!) 58   Temp 97.8 F (36.6 C) (Oral)   Resp 16   Wt 49.8 kg   SpO2 98%   BMI 21.44 kg/m   Physical Exam  Constitutional: She appears well-developed.  Very hard of hearing, elderly  HENT:  Head: Normocephalic and atraumatic.  Mouth/Throat: Oropharynx is clear and moist.  Eyes: Conjunctivae and EOM are normal. Pupils are equal, round, and reactive to light.  Pupils reactive bilaterally  Neck: Normal range of motion.  Cardiovascular: Normal rate, regular rhythm and normal heart sounds.   No murmur heard. Pulmonary/Chest: Effort  normal and breath sounds normal. No respiratory distress. She has no wheezes.  Abdominal: Soft. Bowel sounds are normal.  Musculoskeletal: Normal range of motion.  Neurological: She is alert.  Awake, alert, oriented to self only; following commands when prompted; moving all 4 extremities equally; gait not tested  Skin: Skin is warm and dry.  Psychiatric: She has a normal mood and affect.  Nursing note and vitals reviewed.    ED Treatments / Results  Labs (all labs ordered are listed, but only abnormal results are displayed) Labs Reviewed  CBC - Abnormal; Notable for the following:       Result Value   Hemoglobin 11.3 (*)    HCT 35.1 (*)    All other components within normal limits  DIFFERENTIAL - Abnormal; Notable for the following:    Lymphs Abs 4.8 (*)    All  other components within normal limits  COMPREHENSIVE METABOLIC PANEL - Abnormal; Notable for the following:    Glucose, Bld 106 (*)    Creatinine, Ser 1.06 (*)    Total Protein 6.3 (*)    Albumin 3.4 (*)    ALT 11 (*)    GFR calc non Af Amer 43 (*)    GFR calc Af Amer 50 (*)    All other components within normal limits  CBG MONITORING, ED - Abnormal; Notable for the following:    Glucose-Capillary 100 (*)    All other components within normal limits  I-STAT CHEM 8, ED - Abnormal; Notable for the following:    Chloride 100 (*)    BUN 23 (*)    Creatinine, Ser 1.10 (*)    Glucose, Bld 103 (*)    Calcium, Ion 1.12 (*)    All other components within normal limits  PROTIME-INR  APTT  URINALYSIS, ROUTINE W REFLEX MICROSCOPIC  HEMOGLOBIN A1C  LIPID PANEL  I-STAT TROPOININ, ED    EKG  EKG Interpretation None       Radiology Ct Head Code Stroke W/o Cm  Result Date: 11/25/2016 CLINICAL DATA:  Code stroke. Slurred speech and right-sided weakness with altered mental status. EXAM: CT HEAD WITHOUT CONTRAST TECHNIQUE: Contiguous axial images were obtained from the base of the skull through the vertex without  intravenous contrast. COMPARISON:  None. FINDINGS: Brain: No evidence of acute infarction, hemorrhage, hydrocephalus, extra-axial collection or mass lesion/mass effect. Patchy hypoattenuation of subcortical and periventricular white matter probably represents moderate chronic microvascular ischemic changes. There is moderate brain parenchymal volume loss. Vascular: No hyperdense vessel. Extensive calcific atherosclerosis of cavernous internal carotid arteries. Skull: Normal. Negative for fracture or focal lesion. Sinuses/Orbits: No acute finding. Bilateral intra-ocular lens replacement. Other: None. ASPECTS [facility] Stroke Program Early CT Score) - Ganglionic level infarction (caudate, lentiform nuclei, internal capsule, insula, M1-M3 cortex): 7 - Supraganglionic infarction (M4-M6 cortex): 3 Total score (0-10 with 10 being normal): 10 IMPRESSION: 1. No acute intracranial abnormality identified. 2. ASPECTS is 10 3. Moderate chronic microvascular ischemic changes and parenchymal volume loss of the brain. These results were called by telephone at the time of interpretation on 11/25/2016 at 6:34 pm to Dr. Grace Isaac, who verbally acknowledged these results. Electronically Signed   By: Mitzi Hansen M.D.   On: 11/25/2016 18:36    Procedures Procedures (including critical care time)  Medications Ordered in ED Medications  magnesium hydroxide (MILK OF MAGNESIA) suspension 5 mL (not administered)  DULoxetine (CYMBALTA) DR capsule 60 mg (not administered)  diclofenac sodium (VOLTAREN) 1 % transdermal gel 2 g (not administered)  mirabegron ER (MYRBETRIQ) tablet 25 mg (not administered)  ondansetron (ZOFRAN) tablet 4 mg (not administered)  traMADol (ULTRAM) tablet 25 mg (not administered)  temazepam (RESTORIL) capsule 15 mg (not administered)  Polyethyl Glycol-Propyl Glycol 0.4-0.3 % SOLN 1-2 drop (not administered)  polyethylene glycol powder (GLYCOLAX/MIRALAX) container 127.5 g (not administered)    cycloSPORINE (RESTASIS) 0.05 % ophthalmic emulsion 1 drop (not administered)  acetaminophen (TYLENOL) tablet 650 mg (not administered)  latanoprost (XALATAN) 0.005 % ophthalmic solution 1 drop (not administered)  Lutein CAPS 20 mg (not administered)  dorzolamide-timolol (COSOPT) 22.3-6.8 MG/ML ophthalmic solution 1 drop (not administered)  Cholecalciferol CAPS 2,000 Units (not administered)  levothyroxine (SYNTHROID, LEVOTHROID) tablet 37.5 mcg (not administered)   stroke: mapping our early stages of recovery book (not administered)  0.9 %  sodium chloride infusion (not administered)  senna-docusate (Senokot-S) tablet 1 tablet (not administered)  enoxaparin (LOVENOX) injection 40 mg (not administered)  aspirin suppository 300 mg (not administered)    Or  aspirin tablet 325 mg (not administered)  zolpidem (AMBIEN) tablet 5 mg (not administered)     Initial Impression / Assessment and Plan / ED Course  I have reviewed the triage vital signs and the nursing notes.  Pertinent labs & imaging results that were available during my care of the patient were reviewed by me and considered in my medical decision making (see chart for details).  Clinical Course    83 year old female presenting to the ED as a code stroke. She was last seen normal at 3:30PM when she laid down to take a nap.  She woke up at 5:15PM with slurred speech, confusion, and right sided weakness.  Here symptoms seem to have improved.  she is oriented to self on exam.  She follows commands and moves all extremities when prompted.  She is hard of hearing as does not have her hearing aids in.  After discussion with her son who is her power of attorney, it appears this is somewhat baseline for her after waking up from sleep. Screening lab work here is reassuring. CT head is negative for acute findings. TPA was not given due to improving symptoms upon arrival and son did not consent for this.  Neurology recommends admission for  observation and TIA work-up. This was discussed with patient's son over the telephone as he is currently driving here but will not be here for several more hours, he agreed with plan. Patient admitted to hospitalist service for ongoing care.  Final Clinical Impressions(s) / ED Diagnoses   Final diagnoses:  Right sided weakness  Slurred speech  Stroke (cerebrum) [facility])    New Prescriptions Current Discharge Medication List       Oletha Blend 11/25/16 2122    Raeford Razor, MD 12/05/16 1415

## 2016-11-25 NOTE — ED Triage Notes (Signed)
Pt arrives EMS from Nursing home with [facility] at 1530, took nap and woke at 1714 with slurred,  garbled  Speech. Right sided weakness per EMS. Norm is a&O x 4 and ambulatory   Recent Labs  11/25/16 1813  GLUCAP 100*    1

## 2016-11-25 NOTE — Consult Note (Signed)
Neurology Consultation Reason for Consult: Code Stroke Referring Physician: ED  CC: Sent from facility  History is obtained from: EMS and son  HPI: [name] is a 83 y.o. female presented from retired facility with garbled speech and right sided weakness. On arrival she had difficulty following commands. Pt woke up from nap at 1714 with symptoms and last know was around 1530. Pt's NIHSS was noted to be 3 initially with right arm drift, slurring of words and mild aphasia. I spoke to son for tPA consent however he reported that patient typically gets confused after a nap. He said even if she was having a stroke he would not prefer tPA given the risks associated with it. At baseline patient is able to walk with walker and needs assistance with dressing and toileting. After CT head her symptoms started to improve with NIHSS of 1 for possible mild aphasia.  LKW: 1530 tpa given?: no, Symptoms improved and son did not consent.  ROS: A 14 point ROS was performed and is negative except as noted in the HPI.  Past Medical History:  Diagnosis Date  . Anemia, unspecified   . Anxiety and depression   . Arthritis   . Diabetes mellitus without complication (HCC)   . Diverticulosis   . GERD (gastroesophageal reflux disease)   . Glaucoma   . Hemorrhoids   . HLD (hyperlipidemia)   . Hypothyroidism   . IBS (irritable bowel syndrome)   . Palpitations     Family History  Problem Relation Age of Onset  . Colon cancer Father 57  . Diabetes Mother   . Colon cancer Mother   . Heart attack Brother   . Parkinsonism Brother   . CVA Sister   . Colon cancer Paternal Uncle   . Colon cancer Paternal Grandfather     Social History:  reports that she has never smoked. She has never used smokeless tobacco. She reports that she does not drink alcohol or use drugs.  Exam: Current vital signs: BP 173/71 (BP Location: Right Arm)   Pulse (!) 58   Temp 97.8 F (36.6 C) (Oral)   Resp 16   Wt 49.8  kg (109 lb 12.6 oz)   SpO2 98%   BMI 21.44 kg/m  Vital signs in last 24 hours: Temp:  [97.8 F (36.6 C)] 97.8 F (36.6 C) (01/12 1836) Pulse Rate:  [58] 58 (01/12 1836) Resp:  [16] 16 (01/12 1836) BP: (173)/(71) 173/71 (01/12 1836) SpO2:  [98 %] 98 % (01/12 1836) Weight:  [49.8 kg (109 lb 12.6 oz)] 49.8 kg (109 lb 12.6 oz) (01/12 1800)   Physical Exam  Constitutional: Appears well-developed and well-nourished.  Psych: Affect appropriate to situation Eyes: No scleral injection HENT: No neck rigidity. No carotid bruit. Head: Normocephalic.  Cardiovascular: Normal rate and regular rhythm.  Respiratory: Effort normal and breath sounds normal to anterior ascultation GI: Soft.  No distension. There is no tenderness.  Skin: WDI  Neuro: Mental Status: Patient is awake, alert, oriented to herself only. Has difficulty with hearing. Pt unable to provide any meaningful history. Cranial Nerves: II: Visual Fields are full. Pupils are equal, round, and reactive to light.  III,IV, VI: EOMI without ptosis or diploplia.  V: Facial sensation is symmetric to temperature VII: Facial movement is symmetric.  VIII: hearing is intact to voice X: Uvula elevates symmetrically XI: Shoulder shrug is symmetric. XII: tongue is midline without atrophy or fasciculations.  Motor: Tone is normal. Bulk is normal. 5/5 strength was  present in all four extremities. Sensory: Sensation is symmetric to light touch Plantars: Toes are downgoing bilaterally.  Cerebellar: Difficult to exam due to significant hearing loss.  I have reviewed the images obtained: No hemorrhage and no evidence of acute infarction.  Impression: 83 years old female with history of episodic confusion after naps presented with confusion and possible right sided weakness. Initial exam was consistent with possible stroke however improved significantly. Will need to confirm baseline neuro exam. No tPA due to refusal by her son and patient  is outside of treatment window (3 hours treatment window due to age >49)  Recommendations: -Admit to medicine for observation and TIA workup. -Aspirin 325mg  PO after swallow screen -MRI Bran w/o contrast to rule out an acute stroke. -Stroke labs including Lipid profile and HgbA1C. -TTE to look for any evidence of cardio embolism. -PT/OT/ST evaluation.

## 2016-11-25 NOTE — ED Notes (Signed)
EKG given to Dr. Kohut 

## 2016-11-25 NOTE — ED Notes (Signed)
Radiology dept unable to do MRI at this time.  Taking to the floor to do later on.

## 2016-11-26 ENCOUNTER — Encounter (HOSPITAL_COMMUNITY): Payer: PRIVATE HEALTH INSURANCE

## 2016-11-26 ENCOUNTER — Other Ambulatory Visit (HOSPITAL_COMMUNITY): Payer: PRIVATE HEALTH INSURANCE

## 2016-11-26 DIAGNOSIS — G451 Carotid artery syndrome (hemispheric): Secondary | ICD-10-CM

## 2016-11-26 DIAGNOSIS — F0390 Unspecified dementia without behavioral disturbance: Secondary | ICD-10-CM | POA: Diagnosis not present

## 2016-11-26 DIAGNOSIS — E119 Type 2 diabetes mellitus without complications: Secondary | ICD-10-CM | POA: Diagnosis not present

## 2016-11-26 DIAGNOSIS — G459 Transient cerebral ischemic attack, unspecified: Secondary | ICD-10-CM | POA: Diagnosis not present

## 2016-11-26 DIAGNOSIS — E039 Hypothyroidism, unspecified: Secondary | ICD-10-CM | POA: Diagnosis not present

## 2016-11-26 LAB — LIPID PANEL
CHOL/HDL RATIO: 4.4 ratio
Cholesterol: 256 mg/dL — ABNORMAL HIGH (ref 0–200)
HDL: 58 mg/dL (ref >40)
LDL CALC: 182 mg/dL — AB (ref 0–99)
Triglycerides: 82 mg/dL (ref <150)
VLDL: 16 mg/dL (ref 0–40)

## 2016-11-26 LAB — GLUCOSE, CAPILLARY: Glucose-Capillary: 84 mg/dL (ref 65–99)

## 2016-11-26 LAB — MRSA PCR SCREENING: MRSA BY PCR: NEGATIVE

## 2016-11-26 MED ORDER — ATORVASTATIN CALCIUM 40 MG PO TABS: 40.0000 mg | ORAL_TABLET | Freq: Every day | ORAL | 0 refills | Status: DC

## 2016-11-26 MED ORDER — ASPIRIN 325 MG PO TABS: 325.0000 mg | ORAL_TABLET | Freq: Every day | ORAL | 0 refills | Status: DC

## 2016-11-26 MED ORDER — HYDRALAZINE HCL 20 MG/ML IJ SOLN
5.0000 mg | Freq: Once | INTRAMUSCULAR | Status: AC
  Administered 2016-11-26: 5 mg via INTRAVENOUS

## 2016-11-26 NOTE — Discharge Summary (Addendum)
Physician Discharge Summary  [name]  IHW:[id number]  DOB: 1918-10-10  DOA: 11/25/2016 PCP: Bufford Spikes, DO  Admit date: 11/25/2016 Discharge date: 11/26/2016  Admitted From: SNF Disposition:  SNF  Recommendations for Outpatient Follow-up:  1. Follow up with PCP in 1-2 weeks 2. Please obtain BMP/CBC in one week 3. Recommended repeat CT head - patient refused can try as outpatient as well   Discharge Condition: Improved  CODE STATUS: DNR  Diet recommendation: Heart Healthy / Carb Modified   Brief/Interim Summary: [name] is a 83 y.o. female with medical history significant of hypertension, diet-controlled diabetes, GERD, hypothyroidism, depression, anxiety, IBS, diverticulosis, dementia, who presented with altered mental status, right-sided weakness and slurred speech. Patient had NIHS score of 3 in the ED neurology offered tPA, but family refused. CT head was done which was negative for acute abnormalities, and NIHS score improved to 1 for only mild aphasia. MRI was ordered and patient and family refused. Patient has returned to baseline, per son. Patient was placed in observation for further evaluation. Patient and family decline further work up at this time. They agree on medical treatment with ASA and Lipitor and would like to be discharge to her ALF.   Subjective: Patient seen and examined with son at bedside. Patient is alert and oriented, have no complaints at this time. She feels back to baseline. Son report very mild slurred speech, but significantly improved from admission.   Discharge Diagnoses:  TIA unable to identify brain infarct 2/2 to unclear source - Patient and family refused to purse further work up  Patient back to baseline MRI, CTA H&N and TTE not done as family decline  LDL 182 - was not on statin therapy PTA - will start Lipitor 40 mg  ASA 325 mg daily  Neurology cleared for discharge  Follow up with PCP   Hypothyroidism: Last TSH was 3.01  on 03/31/16 Continue Synthroid  HLD: LDL 182 Start lipitor 40 mg daily   Depression/Mild Dementia:  Continue medications: Cymbalta  Diet controled DM-II: Last A1c 6.5 on 08/08/16, well controled. Patient is not taking meds at home.  -check CBG every morning.  Discharge Instructions  You were cared for by a hospitalist during your hospital stay. If you have any questions about your discharge medications or the care you received while you were in the hospital after you are discharged, you can call the unit and asked to speak with the hospitalist on call if the hospitalist that took care of you is not available. Once you are discharged, your primary care physician will handle any further medical issues. Please note that NO REFILLS for any discharge medications will be authorized once you are discharged, as it is imperative that you return to your primary care physician (or establish a relationship with a primary care physician if you do not have one) for your aftercare needs so that they can reassess your need for medications and monitor your lab values.  Discharge Instructions    Call MD for:  difficulty breathing, headache or visual disturbances    Complete by:  As directed    Call MD for:  extreme fatigue    Complete by:  As directed    Call MD for:  hives    Complete by:  As directed    Call MD for:  persistant dizziness or light-headedness    Complete by:  As directed    Call MD for:  persistant nausea and vomiting    Complete by:  As directed    Call MD for:  redness, tenderness, or signs of infection (pain, swelling, redness, odor or green/yellow discharge around incision site)    Complete by:  As directed    Call MD for:  severe uncontrolled pain    Complete by:  As directed    Call MD for:  temperature >100.4    Complete by:  As directed    Diet - low sodium heart healthy    Complete by:  As directed    Discharge instructions    Complete by:  As directed    Increase  activity slowly    Complete by:  As directed      Allergies as of 11/26/2016      Reactions   Iohexol    IV Dye   Losartan    Naprosyn [naproxen]       Medication List    STOP taking these medications   ibuprofen 200 MG tablet Commonly known as:  ADVIL,MOTRIN     TAKE these medications   acetaminophen 325 MG tablet Commonly known as:  TYLENOL Take 650 mg by mouth every 6 (six) hours as needed. For pain.   aspirin 325 MG tablet Take 1 tablet (325 mg total) by mouth daily. Start taking on:  11/27/2016   atorvastatin 40 MG tablet Commonly known as:  LIPITOR Take 1 tablet (40 mg total) by mouth daily at 6 PM.   bimatoprost 0.01 % Soln Commonly known as:  LUMIGAN Place 1 drop into both eyes at bedtime.   [employer] VITAMIN D 2000 units Caps Generic drug:  Cholecalciferol Take 1 capsule by mouth daily. (vitamin d)   diclofenac sodium 1 % Gel Commonly known as:  VOLTAREN Apply topically. Apply bilateral to shoulders and knees as needed for pain   dorzolamide-timolol 22.3-6.8 MG/ML ophthalmic solution Commonly known as:  COSOPT Place 1 drop into both eyes every morning.   DULoxetine 60 MG capsule Commonly known as:  CYMBALTA Take 60 mg by mouth daily.   levothyroxine 25 MCG tablet Commonly known as:  SYNTHROID, LEVOTHROID Take 37.5 mcg by mouth daily.   Lutein 20 MG Caps Take 1 capsule by mouth every morning.   magnesium hydroxide 400 MG/5ML suspension Commonly known as:  MILK OF MAGNESIA Take by mouth. Give daily as needed for constipation   MYRBETRIQ 25 MG Tb24 tablet Generic drug:  mirabegron ER Take 25 mg by mouth daily.   ondansetron 4 MG tablet Commonly known as:  ZOFRAN Take 4 mg by mouth every 6 (six) hours as needed for nausea.   polyethylene glycol powder powder Commonly known as:  GLYCOLAX/MIRALAX Take 0.5 Containers by mouth 2 (two) times daily as needed for mild constipation.   RESTASIS 0.05 % ophthalmic emulsion Generic drug:   cycloSPORINE Place 1 drop into both eyes every 12 (twelve) hours.   SYSTANE 0.4-0.3 % Soln Generic drug:  Polyethyl Glycol-Propyl Glycol Apply 1-2 drops to eye every hour as needed.   temazepam 15 MG capsule Commonly known as:  RESTORIL Take 15 mg by mouth at bedtime.   traMADol 50 MG tablet Commonly known as:  ULTRAM Take 25 mg by mouth 3 (three) times daily.       Allergies  Allergen Reactions  . Iohexol     IV Dye  . Losartan   . Naprosyn [Naproxen]     Consultations:  Neurology    Procedures/Studies: Ct Head Code Stroke W/o Cm  Result Date: 11/25/2016 CLINICAL DATA:  Code stroke. Slurred  speech and right-sided weakness with altered mental status. EXAM: CT HEAD WITHOUT CONTRAST TECHNIQUE: Contiguous axial images were obtained from the base of the skull through the vertex without intravenous contrast. COMPARISON:  None. FINDINGS: Brain: No evidence of acute infarction, hemorrhage, hydrocephalus, extra-axial collection or mass lesion/mass effect. Patchy hypoattenuation of subcortical and periventricular white matter probably represents moderate chronic microvascular ischemic changes. There is moderate brain parenchymal volume loss. Vascular: No hyperdense vessel. Extensive calcific atherosclerosis of cavernous internal carotid arteries. Skull: Normal. Negative for fracture or focal lesion. Sinuses/Orbits: No acute finding. Bilateral intra-ocular lens replacement. Other: None. ASPECTS [facility] Stroke Program Early CT Score) - Ganglionic level infarction (caudate, lentiform nuclei, internal capsule, insula, M1-M3 cortex): 7 - Supraganglionic infarction (M4-M6 cortex): 3 Total score (0-10 with 10 being normal): 10 IMPRESSION: 1. No acute intracranial abnormality identified. 2. ASPECTS is 10 3. Moderate chronic microvascular ischemic changes and parenchymal volume loss of the brain. These results were called by telephone at the time of interpretation on 11/25/2016 at 6:34 pm to Dr.  Grace Isaac, who verbally acknowledged these results. Electronically Signed   By: Mitzi Hansen M.D.   On: 11/25/2016 18:36     Discharge Exam: Vitals:   11/26/16 0400 11/26/16 0600  BP: (!) 175/63 (!) 170/67  Pulse: 60 63  Resp: 16 16  Temp: 98.2 F (36.8 C) 98.2 F (36.8 C)   Vitals:   11/26/16 0000 11/26/16 0200 11/26/16 0400 11/26/16 0600  BP: 138/74 (!) 169/58 (!) 175/63 (!) 170/67  Pulse:  (!) 58 60 63  Resp: 14 15 16 16   Temp: 98.1 F (36.7 C) 98.1 F (36.7 C) 98.2 F (36.8 C) 98.2 F (36.8 C)  TempSrc: Oral Oral Oral Oral  SpO2: 99% 98% 95% 93%  Weight:        General: Pt is alert, awake, not in acute distress Cardiovascular: RRR, S1/S2 +, no rubs, no gallops Respiratory: CTA bilaterally, no wheezing, no rhonchi Abdominal: Soft, NT, ND, bowel sounds + Extremities: no edema, no cyanosis strength 4/5 x 4  Neuro: Oriented to time, place and person. No focal deficit noted. Sensation intact    The results of significant diagnostics from this hospitalization (including imaging, microbiology, ancillary and laboratory) are listed below for reference.     Microbiology: Recent Results (from the past 240 hour(s))  MRSA PCR Screening     Status: None   Collection Time: 11/26/16  2:52 AM  Result Value Ref Range Status   MRSA by PCR NEGATIVE NEGATIVE Final    Comment:        The GeneXpert MRSA Assay (FDA approved for NASAL specimens only), is one component of a comprehensive MRSA colonization surveillance program. It is not intended to diagnose MRSA infection nor to guide or monitor treatment for MRSA infections.      Labs: BNP (last 3 results) No results for input(s): BNP in the last 8760 hours. Basic Metabolic Panel:  Recent Labs Lab 11/25/16 1814 11/25/16 1821  NA 137 137  K 4.1 4.0  CL 102 100*  CO2 30  --   GLUCOSE 106* 103*  BUN 19 23*  CREATININE 1.06* 1.10*  CALCIUM 8.9  --    Liver Function Tests:  Recent Labs Lab 11/25/16 1814   AST 19  ALT 11*  ALKPHOS 74  BILITOT 0.5  PROT 6.3*  ALBUMIN 3.4*   No results for input(s): LIPASE, AMYLASE in the last 168 hours. No results for input(s): AMMONIA in the last 168 hours. CBC:  Recent  Labs Lab 11/25/16 1814 11/25/16 1821  WBC 10.4  --   NEUTROABS 4.7  --   HGB 11.3* 12.6  HCT 35.1* 37.0  MCV 88.0  --   PLT 228  --    Cardiac Enzymes: No results for input(s): CKTOTAL, CKMB, CKMBINDEX, TROPONINI in the last 168 hours. BNP: Invalid input(s): POCBNP CBG:  Recent Labs Lab 11/25/16 1813 11/26/16 0716  GLUCAP 100* 84   D-Dimer No results for input(s): DDIMER in the last 72 hours. Hgb A1c No results for input(s): HGBA1C in the last 72 hours. Lipid Profile  Recent Labs  11/26/16 0249  CHOL 256*  HDL 58  LDLCALC 182*  TRIG 82  CHOLHDL 4.4   Thyroid function studies No results for input(s): TSH, T4TOTAL, T3FREE, THYROIDAB in the last 72 hours.  Invalid input(s): FREET3 Anemia work up No results for input(s): VITAMINB12, FOLATE, FERRITIN, TIBC, IRON, RETICCTPCT in the last 72 hours. Urinalysis    Component Value Date/Time   COLORURINE YELLOW 11/25/2016 1920   APPEARANCEUR CLEAR 11/25/2016 1920   LABSPEC 1.011 11/25/2016 1920   PHURINE 7.0 11/25/2016 1920   GLUCOSEU NEGATIVE 11/25/2016 1920   HGBUR NEGATIVE 11/25/2016 1920   BILIRUBINUR NEGATIVE 11/25/2016 1920   KETONESUR NEGATIVE 11/25/2016 1920   PROTEINUR NEGATIVE 11/25/2016 1920   NITRITE NEGATIVE 11/25/2016 1920   LEUKOCYTESUR NEGATIVE 11/25/2016 1920   Sepsis Labs Invalid input(s): PROCALCITONIN,  WBC,  LACTICIDVEN Microbiology Recent Results (from the past 240 hour(s))  MRSA PCR Screening     Status: None   Collection Time: 11/26/16  2:52 AM  Result Value Ref Range Status   MRSA by PCR NEGATIVE NEGATIVE Final    Comment:        The GeneXpert MRSA Assay (FDA approved for NASAL specimens only), is one component of a comprehensive MRSA colonization surveillance program. It  is not intended to diagnose MRSA infection nor to guide or monitor treatment for MRSA infections.     Time coordinating discharge: Over 30 minutes  SIGNED:  Latrelle Dodrill, MD  [facility] 11/26/2016, 2:59 PM Pager   If 7PM-7AM, please contact night-coverage [email or web address] Password [email or web address]

## 2016-11-26 NOTE — Progress Notes (Signed)
PT Cancellation Note  Patient Details Name: [name] MRN: [id number] DOB: May 11, 1918   Cancelled Treatment:    Reason Eval/Treat Not Completed: Other (comment). Contradicting activity orders at this time, please discontinue bedrest orders to allow for evaluation.   [name] 11/26/2016, 11:38 AM [name], PT DPT  [phone]

## 2016-11-26 NOTE — Progress Notes (Addendum)
STROKE TEAM PROGRESS NOTE   HISTORY OF PRESENT ILLNESS (per record) [name] is a 83 y.o. female presented from retired facility with garbled speech and right sided weakness. On arrival she had difficulty following commands. Pt woke up from nap at 1714 with symptoms and last know was around 1530. Pt's NIHSS was noted to be 3 initially with right arm drift, slurring of words and mild aphasia. I spoke to son for tPA consent however he reported that patient typically gets confused after a nap. He said even if she was having a stroke he would not prefer tPA given the risks associated with it. At baseline patient is able to walk with walker and needs assistance with dressing and toileting. After CT head her symptoms started to improve with NIHSS of 1 for possible mild aphasia.  LKW: 1530 tpa given?: no, Symptoms improved and son did not consent.  ROS: A 14 point ROS was performed and is negative except as noted in the HPI   SUBJECTIVE (INTERVAL HISTORY) Her son and daughter in law are at the bedside. Pt Sitting in the bed for lunch. No acute distress. Overall they feels her condition is back to her baseline. Son said pt usually confused and disorientated after nap, and she sometimes talking gibberish. She also has chronic arm weakness after her fall long time ago and now they did not see any difference from her baseline. As per son, pt has said several time to him that she is going to heaven soon. Family discussed with pt and they asked no more testing but ok with add ASA. They would like pt to be sent back to [facility] today.    OBJECTIVE Temp:  [97.7 F (36.5 C)-98.2 F (36.8 C)] 98.2 F (36.8 C) (01/13 0600) Pulse Rate:  [57-64] 63 (01/13 0600) Cardiac Rhythm: Other (Comment) (01/13 0700) Resp:  [12-20] 16 (01/13 0600) BP: (117-180)/(53-90) 170/67 (01/13 0600) SpO2:  [93 %-99 %] 93 % (01/13 0600) Weight:  [49.8 kg (109 lb 12.6 oz)] 49.8 kg (109 lb 12.6 oz) (01/12 1800)  CBC:   Recent Labs Lab 11/25/16 1814 11/25/16 1821  WBC 10.4  --   NEUTROABS 4.7  --   HGB 11.3* 12.6  HCT 35.1* 37.0  MCV 88.0  --   PLT 228  --     Basic Metabolic Panel:  Recent Labs Lab 11/25/16 1814 11/25/16 1821  NA 137 137  K 4.1 4.0  CL 102 100*  CO2 30  --   GLUCOSE 106* 103*  BUN 19 23*  CREATININE 1.06* 1.10*  CALCIUM 8.9  --     Lipid Panel:    Component Value Date/Time   CHOL 256 (H) 11/26/2016 0249   TRIG 82 11/26/2016 0249   HDL 58 11/26/2016 0249   CHOLHDL 4.4 11/26/2016 0249   VLDL 16 11/26/2016 0249   LDLCALC 182 (H) 11/26/2016 0249   HgbA1c:  Lab Results  Component Value Date   HGBA1C 6.5 08/08/2016   Urine Drug Screen: No results found for: LABOPIA, COCAINSCRNUR, LABBENZ, AMPHETMU, THCU, LABBARB    IMAGING I have personally reviewed the radiological images below and agree with the radiology interpretations.  Ct Head Code Stroke W/o Cm 11/25/2016 IMPRESSION: 1. No acute intracranial abnormality identified. 2. ASPECTS is 10 3. Moderate chronic microvascular ischemic changes and parenchymal volume loss of the brain.    PHYSICAL EXAM  Temp:  [97.7 F (36.5 C)-98.2 F (36.8 C)] 98.2 F (36.8 C) (01/13 0600) Pulse Rate:  [57-64]  63 (01/13 0600) Resp:  [12-20] 16 (01/13 0600) BP: (117-180)/(53-90) 170/67 (01/13 0600) SpO2:  [93 %-99 %] 93 % (01/13 0600) Weight:  [109 lb 12.6 oz (49.8 kg)] 109 lb 12.6 oz (49.8 kg) (01/12 1800)  General - Well nourished, well developed, in no apparent distress.  Ophthalmologic - Fundi not visualized due to Noncooperation.  Cardiovascular - Regular rate and rhythm.  Mental Status -  Level of arousal and orientation to self and age were intact, but not orientated to place, time, and person. Language including repetition, comprehension was assessed and found intact, however, patient able to name 2/4, also has frequent paraphasic errors and no coherent.  Cranial Nerves II - XII - II - Visual field intact  OU. III, IV, VI - Extraocular movements intact. V - Facial sensation intact bilaterally. VII - Facial movement intact bilaterally. VIII - Hearing & vestibular intact bilaterally. X - Palate elevates symmetrically. XI - Chin turning & shoulder shrug intact bilaterally. XII - Tongue protrusion intact.  Motor Strength - The patient's strength was 4/5 in all extremities and pronator drift was absent.  Bulk was normal and fasciculations were absent.   Motor Tone - Muscle tone was assessed at the neck and appendages and was normal.  Reflexes - The patient's reflexes were 1+ in all extremities and she had no pathological reflexes.  Sensory - Light touch, temperature/pinprick were assessed and were symmetrical.    Coordination - The patient had normal movements in the hands with no ataxia or dysmetria.  Tremor was absent.  Gait and Station - deferred due to safety concerns.   ASSESSMENT/PLAN [name] is a 83 y.o. female with history of DM, HLD living in [facility] nursing [facility] presenting with slurry speech and right upper extremity weakness after nap. She did not receive IV t-PA due to family refusal. As per family, patient now back to baseline.   Possible TIA:  Possible Left brain TIA infarct secondary to unclear source  Resultant  back to baseline as per family  MRI, CUS and TTE not done as patient and family refused  LDL 182  HgbA1c pending  Lovenox for VTE prophylaxis  Diet Heart Room service appropriate? Yes; Fluid consistency: Thin  No antithrombotic prior to admission, now on aspirin 325 mg daily. Continue aspirin on discharge  Patient counseled to be compliant with her antithrombotic medications  Ongoing aggressive stroke risk factor management  Therapy recommendations:  Pending  Disposition:  Back to [facility] nursing home  Hypertension  BP on the high end  Permissive hypertension (OK if < 220/120) but gradually normalize in 5-7 days  Long-term BP  goal normotensive  Hyperlipidemia  Home meds:  None  LDL 182, goal < 70  Add Lipitor 40  Continue statin at discharge  Diabetes  HgbA1c pending, goal < 7.0  CMG stable  Other Stroke Risk Factors  Advanced age  Other Active Problems  Baseline dementia  Patient and family refused further testing and aggressive treatment  They request back to [facility] today  Hospital day # 0  Neurology will sign off. Please call with questions. No neuro follow-up needed at this time. Thanks for the consult.  Marvel Plan, MD PhD Stroke Neurology 11/26/2016 2:23 PM   To contact Stroke Continuity provider, please refer to [email or web address]. After hours, contact General Neurology

## 2016-11-26 NOTE — Progress Notes (Signed)
OT Cancellation Note  Patient Details Name: [name] MRN: [id number] DOB: [birth year]   Cancelled Treatment:    Reason Eval/Treat Not Completed: Other (comment) Contradicting activity orders at this time, please discontinue bedrest orders to allow for evaluation.  Evern Bio [name] 11/26/2016, 3:05 PM  Sherryl Manges OTR/L [phone]

## 2016-11-26 NOTE — Progress Notes (Signed)
Pt's son at bedside-he refused for pt to have more tests to r/o CVA.   He wants his Mother comfortable but no other measures.

## 2016-11-26 NOTE — Progress Notes (Signed)
Spoke w/Nurse [facility], they are ready for pt's return.  I told the Nurse that some of her meds had changed and told her that her son would bring a copy of the D/C instructions when he brings pt back to facility.

## 2016-11-26 NOTE — Progress Notes (Signed)
CSW received consult regarding discharge for pt from ALF. Patient is from [facility]. Per facility, they do not require FL2 since pt has been there less than 24 hours. CSW faxed DC Summary. RN given report number. Family taking patient by car.  CSW signing off.  Osborne Casco Nandita Mathenia LCSWA [phone]

## 2016-11-27 LAB — HEMOGLOBIN A1C
HEMOGLOBIN A1C: 6.2 % — AB (ref 4.8–5.6)
Mean Plasma Glucose: 131 mg/dL

## 2016-11-28 ENCOUNTER — Telehealth: Payer: Self-pay

## 2016-11-28 NOTE — Telephone Encounter (Signed)
Possible re-admission to facility. This is a patient you were seeing at [facility] . [facility] Hospital F/U is needed if patient was re-admitted to facility upon discharge. Hospital discharge from [facility] on 11/26/16.

## 2016-11-29 NOTE — Telephone Encounter (Signed)
Pt has appt next Wednesday in [facility] clinic.

## 2016-12-07 ENCOUNTER — Encounter: Payer: Self-pay | Admitting: Internal Medicine

## 2016-12-07 ENCOUNTER — Non-Acute Institutional Stay (INDEPENDENT_AMBULATORY_CARE_PROVIDER_SITE_OTHER): Payer: Medicare Other | Admitting: Internal Medicine

## 2016-12-07 VITALS — BP 108/60 | HR 79 | Temp 97.7°F | Wt 105.0 lb

## 2016-12-07 DIAGNOSIS — N3281 Overactive bladder: Secondary | ICD-10-CM

## 2016-12-07 DIAGNOSIS — E119 Type 2 diabetes mellitus without complications: Secondary | ICD-10-CM

## 2016-12-07 DIAGNOSIS — G451 Carotid artery syndrome (hemispheric): Secondary | ICD-10-CM

## 2016-12-07 DIAGNOSIS — M1712 Unilateral primary osteoarthritis, left knee: Secondary | ICD-10-CM | POA: Diagnosis not present

## 2016-12-07 DIAGNOSIS — F419 Anxiety disorder, unspecified: Secondary | ICD-10-CM | POA: Diagnosis not present

## 2016-12-07 DIAGNOSIS — E785 Hyperlipidemia, unspecified: Secondary | ICD-10-CM

## 2016-12-07 DIAGNOSIS — R531 Weakness: Secondary | ICD-10-CM

## 2016-12-07 DIAGNOSIS — F324 Major depressive disorder, single episode, in partial remission: Secondary | ICD-10-CM | POA: Diagnosis not present

## 2016-12-07 DIAGNOSIS — Z7189 Other specified counseling: Secondary | ICD-10-CM | POA: Diagnosis not present

## 2016-12-07 DIAGNOSIS — E039 Hypothyroidism, unspecified: Secondary | ICD-10-CM

## 2016-12-07 DIAGNOSIS — F5105 Insomnia due to other mental disorder: Secondary | ICD-10-CM | POA: Diagnosis not present

## 2016-12-07 MED ORDER — METHYLPREDNISOLONE ACETATE 40 MG/ML IJ SUSP
40.0000 mg | Freq: Once | INTRAMUSCULAR | Status: AC
  Administered 2016-12-07: 40 mg via INTRA_ARTICULAR

## 2016-12-07 NOTE — Progress Notes (Signed)
Location:  [facility] clinic Provider: Malin Sambrano L. Renato Gails, D.O., C.M.D.  Code Status: DNR Goals of Care:  Advanced Directives 12/07/2016  Does Patient Have a Medical Advance Directive? Yes  Type of Advance Directive Out of facility DNR (pink MOST or yellow form);Healthcare Power of [place];Living will  Does patient want to make changes to medical advance directive? No - Patient declined  Copy of Healthcare Power of Attorney in Chart? Yes  Pre-existing out of facility DNR order (yellow form or pink MOST form) Yellow form placed in chart (order not valid for inpatient use);Pink MOST form placed in chart (order not valid for inpatient use)   Chief Complaint  Patient presents with  . Transitions Of Care    s/p hospitalization from 11/25/16-11/26/16    HPI: Patient is a 83 y.o. female seen today for hospital follow-up s/p admission from 1/12-1/13/18 for slurred speech, increased confusion, babble.  She also had increased right sided weakness.  Had TIA and was back to normal in a few hours.  CT was negative for acute findings.  She was started on a cholesterol pill (statin) due to LDL of 182 and asa 325mg .  She is 89 and does not want to take a bunch of pills  These are long term preventative and discussed with her son today that they are not benefiting her overall.  The knee pain continues to bother her.  She can hardly walk anymore.  She's had an injection before, but it didn't help a ton then, but they want to try again.  Shoulder is doing much better.                                                         Past Medical History:  Diagnosis Date  . Anemia, unspecified   . Anxiety and depression   . Arthritis   . Diabetes mellitus without complication (HCC)   . Diverticulosis   . GERD (gastroesophageal reflux disease)   . Glaucoma   . Hemorrhoids   . HLD (hyperlipidemia)   . Hypothyroidism   . IBS (irritable bowel syndrome)   . Palpitations     Past Surgical History:  Procedure Laterality  Date  . APPENDECTOMY    . COLONOSCOPY  04/15/10   diverticulosis, sigmoid AVM, hemorrhoids  . TONSILLECTOMY      Allergies  Allergen Reactions  . Iohexol Other (See Comments)    IV Dye - unknown reaction - listed on MAR  . Losartan Cough  . Milk-Related Compounds Other (See Comments)    Reaction to cow's milk per MAR - GI intolerance  . Naprosyn [Naproxen] Other (See Comments)    Unknown reaction - listed on MAR    Allergies as of 12/07/2016      Reactions   Iohexol Other (See Comments)   IV Dye - unknown reaction - listed on MAR   Losartan Cough   Milk-related Compounds Other (See Comments)   Reaction to cow's milk per [facility] - GI intolerance   Naprosyn [naproxen] Other (See Comments)   Unknown reaction - listed on [facility]      Medication List       Accurate as of 12/07/16  1:41 PM. Always use your most recent med list.          aspirin 325 MG tablet Take 1 tablet (  325 mg total) by mouth daily.   atorvastatin 40 MG tablet Commonly known as:  LIPITOR Take 1 tablet (40 mg total) by mouth daily at 6 PM.   bimatoprost 0.01 % Soln Commonly known as:  LUMIGAN Place 1 drop into both eyes daily before supper.   [employer] VITAMIN D 2000 units Caps Generic drug:  Cholecalciferol Take 2,000 Units by mouth daily before lunch.   cycloSPORINE 0.05 % ophthalmic emulsion Commonly known as:  RESTASIS Place 1 drop into both eyes every 12 (twelve) hours.   dorzolamide-timolol 22.3-6.8 MG/ML ophthalmic solution Commonly known as:  COSOPT Place 1 drop into both eyes 2 (two) times daily.   DULoxetine 60 MG capsule Commonly known as:  CYMBALTA Take 60 mg by mouth daily.   levothyroxine 25 MCG tablet Commonly known as:  SYNTHROID, LEVOTHROID Take 37.5 mcg by mouth daily.   Lutein 20 MG Caps Take 20 mg by mouth daily before lunch.   MYRBETRIQ 25 MG Tb24 tablet Generic drug:  mirabegron ER Take 25 mg by mouth daily.   ondansetron 4 MG tablet Commonly known as:  ZOFRAN Take 4 mg by  mouth every 6 (six) hours as needed for nausea.   polyethylene glycol packet Commonly known as:  MIRALAX / GLYCOLAX Take 8.5 g by mouth at bedtime.   temazepam 15 MG capsule Commonly known as:  RESTORIL Take 15 mg by mouth at bedtime.   traMADol 50 MG tablet Commonly known as:  ULTRAM Take 25 mg by mouth 3 (three) times daily.       Review of Systems:  Review of Systems  Constitutional: Positive for malaise/fatigue and weight loss. Negative for chills and fever.  HENT: Positive for hearing loss.   Eyes: Positive for blurred vision.  Respiratory: Negative for shortness of breath.   Cardiovascular: Negative for chest pain, palpitations and leg swelling.  Gastrointestinal: Positive for constipation. Negative for abdominal pain, blood in stool and melena.  Genitourinary: Negative for dysuria.  Musculoskeletal: Positive for falls and joint pain.       Left knee  Skin: Negative for itching and rash.  Neurological: Positive for weakness. Negative for dizziness, sensory change, speech change, focal weakness, loss of consciousness and headaches.  Endo/Heme/Allergies: Bruises/bleeds easily.  Psychiatric/Behavioral: Positive for memory loss. Negative for depression and suicidal ideas. The patient is nervous/anxious and has insomnia.     Health Maintenance  Topic Date Due  . FOOT EXAM  10/16/1928  . OPHTHALMOLOGY EXAM  [birth year]16/1928  . URINE MICROALBUMIN  10/16/1930  . TETANUS/TDAP  10/17/1939  . ZOSTAVAX  10/16/1980  . DEXA SCAN  10/16/1985  . HEMOGLOBIN A1C  05/26/2017  . INFLUENZA VACCINE  Completed  . PNA vac Low Risk Adult  Completed    Physical Exam: Vitals:   12/07/16 1326  BP: 108/60  Pulse: 79  Temp: 97.7 F (36.5 C)  TempSrc: Oral  SpO2: 94%  Weight: 105 lb (47.6 kg)   Body mass index is 20.51 kg/m. Physical Exam  Constitutional: No distress.  Cardiovascular: Normal rate, regular rhythm, normal heart sounds and intact distal pulses.   Pulmonary/Chest: Effort  normal and breath sounds normal. No respiratory distress.  Abdominal: Soft. Bowel sounds are normal. She exhibits no distension. There is no tenderness.  Musculoskeletal: Normal range of motion. She exhibits tenderness.  Left medial knee  Neurological: She is alert. She displays normal reflexes. No cranial nerve deficit or sensory deficit. She exhibits normal muscle tone. Coordination normal.  Oriented to person, place, not time;  poor short term memory--son reports great long term memory and sometimes remembering things he doesn't and denies that she has dementia  Skin: Skin is warm and dry.  Psychiatric: She has a normal mood and affect.    Labs reviewed: Basic Metabolic Panel:  Recent Labs  09/81/19  08/08/16 0300 11/25/16 1814 11/25/16 1821  NA  --   < > 141 137 137  K  --   < > 4.3 4.1 4.0  CL  --   --   --  102 100*  CO2  --   --   --  30  --   GLUCOSE  --   --   --  106* 103*  BUN  --   < > 20 19 23*  CREATININE  --   < > 1.0 1.06* 1.10*  CALCIUM  --   --   --  8.9  --   TSH 3.10  --   --   --   --   < > = values in this interval not displayed. Liver Function Tests:  Recent Labs  11/25/16 1814  AST 19  ALT 11*  ALKPHOS 74  BILITOT 0.5  PROT 6.3*  ALBUMIN 3.4*   No results for input(s): LIPASE, AMYLASE in the last 8760 hours. No results for input(s): AMMONIA in the last 8760 hours. CBC:  Recent Labs  06/06/16 0508 11/25/16 1814 11/25/16 1821  WBC 8.5 10.4  --   NEUTROABS  --  4.7  --   HGB 11.9* 11.3* 12.6  HCT 37 35.1* 37.0  MCV  --  88.0  --   PLT 222 228  --    Lipid Panel:  Recent Labs  11/26/16 0249  CHOL 256*  HDL 58  LDLCALC 182*  TRIG 82  CHOLHDL 4.4   Lab Results  Component Value Date   HGBA1C 6.2 (H) 11/26/2016    Procedures since last visit: Ct Head Code Stroke W/o Cm  Result Date: 11/25/2016 CLINICAL DATA:  Code stroke. Slurred speech and right-sided weakness with altered mental status. EXAM: CT HEAD WITHOUT CONTRAST  TECHNIQUE: Contiguous axial images were obtained from the base of the skull through the vertex without intravenous contrast. COMPARISON:  None. FINDINGS: Brain: No evidence of acute infarction, hemorrhage, hydrocephalus, extra-axial collection or mass lesion/mass effect. Patchy hypoattenuation of subcortical and periventricular white matter probably represents moderate chronic microvascular ischemic changes. There is moderate brain parenchymal volume loss. Vascular: No hyperdense vessel. Extensive calcific atherosclerosis of cavernous internal carotid arteries. Skull: Normal. Negative for fracture or focal lesion. Sinuses/Orbits: No acute finding. Bilateral intra-ocular lens replacement. Other: None. ASPECTS [facility] Stroke Program Early CT Score) - Ganglionic level infarction (caudate, lentiform nuclei, internal capsule, insula, M1-M3 cortex): 7 - Supraganglionic infarction (M4-M6 cortex): 3 Total score (0-10 with 10 being normal): 10 IMPRESSION: 1. No acute intracranial abnormality identified. 2. ASPECTS is 10 3. Moderate chronic microvascular ischemic changes and parenchymal volume loss of the brain. These results were called by telephone at the time of interpretation on 11/25/2016 at 6:34 pm to Dr. Grace Isaac, who verbally acknowledged these results. Electronically Signed   By: Mitzi Hansen M.D.   On: 11/25/2016 18:36    Assessment/Plan 1. Localized osteoarthritis of left knee -pt with severe end stage DJD left knee not responsive to oral agents and cannot take oral opioids beyond tramadol w/o oversedation and falls - previously tried an injection w/o success, but pain much worse now and she is no longer ambulating hardly at all -  she and her son request to try another depomedrol/lidocaine injection today to see if there is improvement in pain control - methylPREDNISolone acetate (DEPO-MEDROL) injection 40 mg; Inject 1 mL (40 mg total) into the articular space once.  2. Hemispheric carotid artery  syndrome -pt with rightsided weakness and aphasia with TIA but resolved -not a candidate for any interventions at 89 with some dementia, requiring help with ADLs and very frail  3. Controlled type 2 diabetes mellitus without complication, without long-term current use of insulin (HCC) -not on meds,  Lab Results  Component Value Date   HGBA1C 6.2 (H) 11/26/2016   Well controlled, intake not impressive so no need to change anything  4. Right sided weakness -some mild chronic right-sided weakness--sometimes I can detect it at visits and other times I cannot oddly enough and today it was not notable and her speech is back to normal  5. Insomnia secondary to anxiety -on longstanding temazepam and I have not had success at tapering this despite efforts  6. Hyperlipidemia, unspecified hyperlipidemia type -discussed with her son that statin therapy is for stroke prevention and typically works by lowering ldl and preventing cholesterol buildup plus a possible antiinflammatory effect -he and I agreed that the risks of weakness, increased pill burden (she despises taking her pills) outweigh benefits of a statin for her--he does not feel she has great quality of life in her current state of health; same with asa at this pont in her life  7. Major depressive disorder with single episode, in partial remission (HCC) -she seems to have some residual depression despite 60mg  of cymbalta though she does not outright admit to classic symptoms  8. Hypothyroidism, unspecified type -cont levothyroxine 37.26mcg daily  9. OAB (overactive bladder) -her son reports she has had some benefit from the myrbetriq so we opted to continue it  10.  Advance care planning: -Leon and I reviewed her MOST form which was updated to read:  DNR, comfort measures with do not hospitalize (she got hospitalized this time b/c a family member besides the POAs who were both out of town got contacted who was not aware of the MOST),  IVFs and abx for limited time if we agree upon it, NO FEEDING TUBE was the big change and emphasis upon do not hospitalize added -ACP took approximately 25 mins while remainder of visit for med mgt, injection/TOC took approximately another 40 mins including review of hospital records   Labs/tests ordered:  MOST updated  Next appt:  03/08/2017  Jarae Nemmers L. Kena Limon, D.O. Geriatrics [facility] [address], [place] [address] Cell Phone (Mon-Fri 8am-5pm):  [phone] On Call:  [phone] & follow prompts after 5pm & weekends Office Phone:  [phone] Office Fax:  [phone]

## 2016-12-11 DIAGNOSIS — Z7189 Other specified counseling: Secondary | ICD-10-CM | POA: Insufficient documentation

## 2016-12-14 ENCOUNTER — Telehealth: Payer: Self-pay | Admitting: *Deleted

## 2016-12-14 NOTE — Telephone Encounter (Signed)
Patient son, Loraine Leriche called and stated that he would like to speak with you regarding his mothers condition. Stated that she had a mini stroke and in [place]. He would like for you to call him regarding her condition and to ask about Hospice. Please Call #[phone]

## 2016-12-14 NOTE — Telephone Encounter (Signed)
Spoke with nurse manager Dois Davenport @ [facility] and she will speak with son.

## 2016-12-15 DIAGNOSIS — E1159 Type 2 diabetes mellitus with other circulatory complications: Secondary | ICD-10-CM | POA: Diagnosis not present

## 2016-12-15 DIAGNOSIS — F0151 Vascular dementia with behavioral disturbance: Secondary | ICD-10-CM | POA: Diagnosis not present

## 2016-12-15 DIAGNOSIS — I10 Essential (primary) hypertension: Secondary | ICD-10-CM | POA: Diagnosis not present

## 2016-12-15 DIAGNOSIS — R54 Age-related physical debility: Secondary | ICD-10-CM | POA: Diagnosis not present

## 2016-12-15 DIAGNOSIS — H409 Unspecified glaucoma: Secondary | ICD-10-CM | POA: Diagnosis not present

## 2016-12-15 DIAGNOSIS — N183 Chronic kidney disease, stage 3 (moderate): Secondary | ICD-10-CM | POA: Diagnosis not present

## 2016-12-15 DIAGNOSIS — R1310 Dysphagia, unspecified: Secondary | ICD-10-CM | POA: Diagnosis not present

## 2016-12-15 DIAGNOSIS — Z8673 Personal history of transient ischemic attack (TIA), and cerebral infarction without residual deficits: Secondary | ICD-10-CM | POA: Diagnosis not present

## 2016-12-15 DIAGNOSIS — H11149 Conjunctival xerosis, unspecified, unspecified eye: Secondary | ICD-10-CM | POA: Diagnosis not present

## 2016-12-15 DIAGNOSIS — I679 Cerebrovascular disease, unspecified: Secondary | ICD-10-CM | POA: Diagnosis not present

## 2016-12-15 DIAGNOSIS — E039 Hypothyroidism, unspecified: Secondary | ICD-10-CM | POA: Diagnosis not present

## 2016-12-15 DIAGNOSIS — F411 Generalized anxiety disorder: Secondary | ICD-10-CM | POA: Diagnosis not present

## 2016-12-15 DIAGNOSIS — E785 Hyperlipidemia, unspecified: Secondary | ICD-10-CM | POA: Diagnosis not present

## 2016-12-15 DIAGNOSIS — F339 Major depressive disorder, recurrent, unspecified: Secondary | ICD-10-CM | POA: Diagnosis not present

## 2016-12-15 DIAGNOSIS — R4701 Aphasia: Secondary | ICD-10-CM | POA: Diagnosis not present

## 2016-12-16 DIAGNOSIS — Z8673 Personal history of transient ischemic attack (TIA), and cerebral infarction without residual deficits: Secondary | ICD-10-CM | POA: Diagnosis not present

## 2016-12-16 DIAGNOSIS — N183 Chronic kidney disease, stage 3 (moderate): Secondary | ICD-10-CM | POA: Diagnosis not present

## 2016-12-16 DIAGNOSIS — E785 Hyperlipidemia, unspecified: Secondary | ICD-10-CM | POA: Diagnosis not present

## 2016-12-16 DIAGNOSIS — F0151 Vascular dementia with behavioral disturbance: Secondary | ICD-10-CM | POA: Diagnosis not present

## 2016-12-16 DIAGNOSIS — I10 Essential (primary) hypertension: Secondary | ICD-10-CM | POA: Diagnosis not present

## 2016-12-16 DIAGNOSIS — I679 Cerebrovascular disease, unspecified: Secondary | ICD-10-CM | POA: Diagnosis not present

## 2016-12-17 DIAGNOSIS — F0151 Vascular dementia with behavioral disturbance: Secondary | ICD-10-CM | POA: Diagnosis not present

## 2016-12-17 DIAGNOSIS — E785 Hyperlipidemia, unspecified: Secondary | ICD-10-CM | POA: Diagnosis not present

## 2016-12-17 DIAGNOSIS — I679 Cerebrovascular disease, unspecified: Secondary | ICD-10-CM | POA: Diagnosis not present

## 2016-12-17 DIAGNOSIS — Z8673 Personal history of transient ischemic attack (TIA), and cerebral infarction without residual deficits: Secondary | ICD-10-CM | POA: Diagnosis not present

## 2016-12-17 DIAGNOSIS — I10 Essential (primary) hypertension: Secondary | ICD-10-CM | POA: Diagnosis not present

## 2016-12-17 DIAGNOSIS — N183 Chronic kidney disease, stage 3 (moderate): Secondary | ICD-10-CM | POA: Diagnosis not present

## 2016-12-19 DIAGNOSIS — N183 Chronic kidney disease, stage 3 (moderate): Secondary | ICD-10-CM | POA: Diagnosis not present

## 2016-12-19 DIAGNOSIS — F0151 Vascular dementia with behavioral disturbance: Secondary | ICD-10-CM | POA: Diagnosis not present

## 2016-12-19 DIAGNOSIS — E785 Hyperlipidemia, unspecified: Secondary | ICD-10-CM | POA: Diagnosis not present

## 2016-12-19 DIAGNOSIS — I10 Essential (primary) hypertension: Secondary | ICD-10-CM | POA: Diagnosis not present

## 2016-12-19 DIAGNOSIS — Z8673 Personal history of transient ischemic attack (TIA), and cerebral infarction without residual deficits: Secondary | ICD-10-CM | POA: Diagnosis not present

## 2016-12-19 DIAGNOSIS — I679 Cerebrovascular disease, unspecified: Secondary | ICD-10-CM | POA: Diagnosis not present

## 2016-12-21 DIAGNOSIS — F0151 Vascular dementia with behavioral disturbance: Secondary | ICD-10-CM | POA: Diagnosis not present

## 2016-12-21 DIAGNOSIS — E785 Hyperlipidemia, unspecified: Secondary | ICD-10-CM | POA: Diagnosis not present

## 2016-12-21 DIAGNOSIS — Z8673 Personal history of transient ischemic attack (TIA), and cerebral infarction without residual deficits: Secondary | ICD-10-CM | POA: Diagnosis not present

## 2016-12-21 DIAGNOSIS — I679 Cerebrovascular disease, unspecified: Secondary | ICD-10-CM | POA: Diagnosis not present

## 2016-12-21 DIAGNOSIS — I10 Essential (primary) hypertension: Secondary | ICD-10-CM | POA: Diagnosis not present

## 2016-12-21 DIAGNOSIS — N183 Chronic kidney disease, stage 3 (moderate): Secondary | ICD-10-CM | POA: Diagnosis not present

## 2016-12-23 DIAGNOSIS — Z8673 Personal history of transient ischemic attack (TIA), and cerebral infarction without residual deficits: Secondary | ICD-10-CM | POA: Diagnosis not present

## 2016-12-23 DIAGNOSIS — I10 Essential (primary) hypertension: Secondary | ICD-10-CM | POA: Diagnosis not present

## 2016-12-23 DIAGNOSIS — E785 Hyperlipidemia, unspecified: Secondary | ICD-10-CM | POA: Diagnosis not present

## 2016-12-23 DIAGNOSIS — F0151 Vascular dementia with behavioral disturbance: Secondary | ICD-10-CM | POA: Diagnosis not present

## 2016-12-23 DIAGNOSIS — N183 Chronic kidney disease, stage 3 (moderate): Secondary | ICD-10-CM | POA: Diagnosis not present

## 2016-12-23 DIAGNOSIS — I679 Cerebrovascular disease, unspecified: Secondary | ICD-10-CM | POA: Diagnosis not present

## 2016-12-24 DIAGNOSIS — N183 Chronic kidney disease, stage 3 (moderate): Secondary | ICD-10-CM | POA: Diagnosis not present

## 2016-12-24 DIAGNOSIS — I679 Cerebrovascular disease, unspecified: Secondary | ICD-10-CM | POA: Diagnosis not present

## 2016-12-24 DIAGNOSIS — I10 Essential (primary) hypertension: Secondary | ICD-10-CM | POA: Diagnosis not present

## 2016-12-24 DIAGNOSIS — Z8673 Personal history of transient ischemic attack (TIA), and cerebral infarction without residual deficits: Secondary | ICD-10-CM | POA: Diagnosis not present

## 2016-12-24 DIAGNOSIS — E785 Hyperlipidemia, unspecified: Secondary | ICD-10-CM | POA: Diagnosis not present

## 2016-12-24 DIAGNOSIS — F0151 Vascular dementia with behavioral disturbance: Secondary | ICD-10-CM | POA: Diagnosis not present

## 2016-12-26 DIAGNOSIS — N183 Chronic kidney disease, stage 3 (moderate): Secondary | ICD-10-CM | POA: Diagnosis not present

## 2016-12-26 DIAGNOSIS — I679 Cerebrovascular disease, unspecified: Secondary | ICD-10-CM | POA: Diagnosis not present

## 2016-12-26 DIAGNOSIS — Z8673 Personal history of transient ischemic attack (TIA), and cerebral infarction without residual deficits: Secondary | ICD-10-CM | POA: Diagnosis not present

## 2016-12-26 DIAGNOSIS — I10 Essential (primary) hypertension: Secondary | ICD-10-CM | POA: Diagnosis not present

## 2016-12-26 DIAGNOSIS — E785 Hyperlipidemia, unspecified: Secondary | ICD-10-CM | POA: Diagnosis not present

## 2016-12-26 DIAGNOSIS — F0151 Vascular dementia with behavioral disturbance: Secondary | ICD-10-CM | POA: Diagnosis not present

## 2016-12-28 DIAGNOSIS — N183 Chronic kidney disease, stage 3 (moderate): Secondary | ICD-10-CM | POA: Diagnosis not present

## 2016-12-28 DIAGNOSIS — F0151 Vascular dementia with behavioral disturbance: Secondary | ICD-10-CM | POA: Diagnosis not present

## 2016-12-28 DIAGNOSIS — I10 Essential (primary) hypertension: Secondary | ICD-10-CM | POA: Diagnosis not present

## 2016-12-28 DIAGNOSIS — Z8673 Personal history of transient ischemic attack (TIA), and cerebral infarction without residual deficits: Secondary | ICD-10-CM | POA: Diagnosis not present

## 2016-12-28 DIAGNOSIS — E785 Hyperlipidemia, unspecified: Secondary | ICD-10-CM | POA: Diagnosis not present

## 2016-12-28 DIAGNOSIS — I679 Cerebrovascular disease, unspecified: Secondary | ICD-10-CM | POA: Diagnosis not present

## 2016-12-29 DIAGNOSIS — I679 Cerebrovascular disease, unspecified: Secondary | ICD-10-CM | POA: Diagnosis not present

## 2016-12-29 DIAGNOSIS — F0151 Vascular dementia with behavioral disturbance: Secondary | ICD-10-CM | POA: Diagnosis not present

## 2016-12-29 DIAGNOSIS — E785 Hyperlipidemia, unspecified: Secondary | ICD-10-CM | POA: Diagnosis not present

## 2016-12-29 DIAGNOSIS — I10 Essential (primary) hypertension: Secondary | ICD-10-CM | POA: Diagnosis not present

## 2016-12-29 DIAGNOSIS — N183 Chronic kidney disease, stage 3 (moderate): Secondary | ICD-10-CM | POA: Diagnosis not present

## 2016-12-29 DIAGNOSIS — Z8673 Personal history of transient ischemic attack (TIA), and cerebral infarction without residual deficits: Secondary | ICD-10-CM | POA: Diagnosis not present

## 2017-01-02 DIAGNOSIS — I10 Essential (primary) hypertension: Secondary | ICD-10-CM | POA: Diagnosis not present

## 2017-01-02 DIAGNOSIS — E785 Hyperlipidemia, unspecified: Secondary | ICD-10-CM | POA: Diagnosis not present

## 2017-01-02 DIAGNOSIS — F0151 Vascular dementia with behavioral disturbance: Secondary | ICD-10-CM | POA: Diagnosis not present

## 2017-01-02 DIAGNOSIS — N183 Chronic kidney disease, stage 3 (moderate): Secondary | ICD-10-CM | POA: Diagnosis not present

## 2017-01-02 DIAGNOSIS — Z8673 Personal history of transient ischemic attack (TIA), and cerebral infarction without residual deficits: Secondary | ICD-10-CM | POA: Diagnosis not present

## 2017-01-02 DIAGNOSIS — I679 Cerebrovascular disease, unspecified: Secondary | ICD-10-CM | POA: Diagnosis not present

## 2017-01-06 DIAGNOSIS — N183 Chronic kidney disease, stage 3 (moderate): Secondary | ICD-10-CM | POA: Diagnosis not present

## 2017-01-06 DIAGNOSIS — I679 Cerebrovascular disease, unspecified: Secondary | ICD-10-CM | POA: Diagnosis not present

## 2017-01-06 DIAGNOSIS — Z8673 Personal history of transient ischemic attack (TIA), and cerebral infarction without residual deficits: Secondary | ICD-10-CM | POA: Diagnosis not present

## 2017-01-06 DIAGNOSIS — I10 Essential (primary) hypertension: Secondary | ICD-10-CM | POA: Diagnosis not present

## 2017-01-06 DIAGNOSIS — E785 Hyperlipidemia, unspecified: Secondary | ICD-10-CM | POA: Diagnosis not present

## 2017-01-06 DIAGNOSIS — F0151 Vascular dementia with behavioral disturbance: Secondary | ICD-10-CM | POA: Diagnosis not present

## 2017-01-10 DIAGNOSIS — Z8673 Personal history of transient ischemic attack (TIA), and cerebral infarction without residual deficits: Secondary | ICD-10-CM | POA: Diagnosis not present

## 2017-01-10 DIAGNOSIS — I679 Cerebrovascular disease, unspecified: Secondary | ICD-10-CM | POA: Diagnosis not present

## 2017-01-10 DIAGNOSIS — E785 Hyperlipidemia, unspecified: Secondary | ICD-10-CM | POA: Diagnosis not present

## 2017-01-10 DIAGNOSIS — N183 Chronic kidney disease, stage 3 (moderate): Secondary | ICD-10-CM | POA: Diagnosis not present

## 2017-01-10 DIAGNOSIS — I10 Essential (primary) hypertension: Secondary | ICD-10-CM | POA: Diagnosis not present

## 2017-01-10 DIAGNOSIS — F0151 Vascular dementia with behavioral disturbance: Secondary | ICD-10-CM | POA: Diagnosis not present

## 2017-01-12 DIAGNOSIS — E1159 Type 2 diabetes mellitus with other circulatory complications: Secondary | ICD-10-CM | POA: Diagnosis not present

## 2017-01-12 DIAGNOSIS — E039 Hypothyroidism, unspecified: Secondary | ICD-10-CM | POA: Diagnosis not present

## 2017-01-12 DIAGNOSIS — F0151 Vascular dementia with behavioral disturbance: Secondary | ICD-10-CM | POA: Diagnosis not present

## 2017-01-12 DIAGNOSIS — Z8673 Personal history of transient ischemic attack (TIA), and cerebral infarction without residual deficits: Secondary | ICD-10-CM | POA: Diagnosis not present

## 2017-01-12 DIAGNOSIS — I679 Cerebrovascular disease, unspecified: Secondary | ICD-10-CM | POA: Diagnosis not present

## 2017-01-12 DIAGNOSIS — N183 Chronic kidney disease, stage 3 (moderate): Secondary | ICD-10-CM | POA: Diagnosis not present

## 2017-01-12 DIAGNOSIS — E785 Hyperlipidemia, unspecified: Secondary | ICD-10-CM | POA: Diagnosis not present

## 2017-01-12 DIAGNOSIS — R54 Age-related physical debility: Secondary | ICD-10-CM | POA: Diagnosis not present

## 2017-01-12 DIAGNOSIS — R4701 Aphasia: Secondary | ICD-10-CM | POA: Diagnosis not present

## 2017-01-12 DIAGNOSIS — I10 Essential (primary) hypertension: Secondary | ICD-10-CM | POA: Diagnosis not present

## 2017-01-12 DIAGNOSIS — F339 Major depressive disorder, recurrent, unspecified: Secondary | ICD-10-CM | POA: Diagnosis not present

## 2017-01-12 DIAGNOSIS — R1310 Dysphagia, unspecified: Secondary | ICD-10-CM | POA: Diagnosis not present

## 2017-01-12 DIAGNOSIS — H11149 Conjunctival xerosis, unspecified, unspecified eye: Secondary | ICD-10-CM | POA: Diagnosis not present

## 2017-01-12 DIAGNOSIS — F411 Generalized anxiety disorder: Secondary | ICD-10-CM | POA: Diagnosis not present

## 2017-01-12 DIAGNOSIS — H409 Unspecified glaucoma: Secondary | ICD-10-CM | POA: Diagnosis not present

## 2017-01-13 DIAGNOSIS — I10 Essential (primary) hypertension: Secondary | ICD-10-CM | POA: Diagnosis not present

## 2017-01-13 DIAGNOSIS — E785 Hyperlipidemia, unspecified: Secondary | ICD-10-CM | POA: Diagnosis not present

## 2017-01-13 DIAGNOSIS — Z8673 Personal history of transient ischemic attack (TIA), and cerebral infarction without residual deficits: Secondary | ICD-10-CM | POA: Diagnosis not present

## 2017-01-13 DIAGNOSIS — N183 Chronic kidney disease, stage 3 (moderate): Secondary | ICD-10-CM | POA: Diagnosis not present

## 2017-01-13 DIAGNOSIS — I679 Cerebrovascular disease, unspecified: Secondary | ICD-10-CM | POA: Diagnosis not present

## 2017-01-13 DIAGNOSIS — F0151 Vascular dementia with behavioral disturbance: Secondary | ICD-10-CM | POA: Diagnosis not present

## 2017-01-16 DIAGNOSIS — N183 Chronic kidney disease, stage 3 (moderate): Secondary | ICD-10-CM | POA: Diagnosis not present

## 2017-01-16 DIAGNOSIS — Z8673 Personal history of transient ischemic attack (TIA), and cerebral infarction without residual deficits: Secondary | ICD-10-CM | POA: Diagnosis not present

## 2017-01-16 DIAGNOSIS — F0151 Vascular dementia with behavioral disturbance: Secondary | ICD-10-CM | POA: Diagnosis not present

## 2017-01-16 DIAGNOSIS — I10 Essential (primary) hypertension: Secondary | ICD-10-CM | POA: Diagnosis not present

## 2017-01-16 DIAGNOSIS — E785 Hyperlipidemia, unspecified: Secondary | ICD-10-CM | POA: Diagnosis not present

## 2017-01-16 DIAGNOSIS — I679 Cerebrovascular disease, unspecified: Secondary | ICD-10-CM | POA: Diagnosis not present

## 2017-01-17 DIAGNOSIS — I10 Essential (primary) hypertension: Secondary | ICD-10-CM | POA: Diagnosis not present

## 2017-01-17 DIAGNOSIS — Z8673 Personal history of transient ischemic attack (TIA), and cerebral infarction without residual deficits: Secondary | ICD-10-CM | POA: Diagnosis not present

## 2017-01-17 DIAGNOSIS — F0151 Vascular dementia with behavioral disturbance: Secondary | ICD-10-CM | POA: Diagnosis not present

## 2017-01-17 DIAGNOSIS — E785 Hyperlipidemia, unspecified: Secondary | ICD-10-CM | POA: Diagnosis not present

## 2017-01-17 DIAGNOSIS — N183 Chronic kidney disease, stage 3 (moderate): Secondary | ICD-10-CM | POA: Diagnosis not present

## 2017-01-17 DIAGNOSIS — I679 Cerebrovascular disease, unspecified: Secondary | ICD-10-CM | POA: Diagnosis not present

## 2017-02-12 DEATH — deceased

## 2017-03-08 ENCOUNTER — Encounter: Payer: Self-pay | Admitting: Internal Medicine
